# Patient Record
Sex: Female | Born: 1970 | Race: White | Hispanic: No | Smoking: Current every day smoker
Health system: Southern US, Community
[De-identification: ages and names within clinical notes are randomized; demographics above are authoritative.]

## PROBLEM LIST (undated history)

## (undated) DIAGNOSIS — R112 Nausea with vomiting, unspecified: Secondary | ICD-10-CM

## (undated) DIAGNOSIS — F17200 Nicotine dependence, unspecified, uncomplicated: Secondary | ICD-10-CM

## (undated) DIAGNOSIS — Z853 Personal history of malignant neoplasm of breast: Secondary | ICD-10-CM

## (undated) DIAGNOSIS — T4145XA Adverse effect of unspecified anesthetic, initial encounter: Secondary | ICD-10-CM

## (undated) DIAGNOSIS — F419 Anxiety disorder, unspecified: Secondary | ICD-10-CM

## (undated) DIAGNOSIS — F329 Major depressive disorder, single episode, unspecified: Secondary | ICD-10-CM

## (undated) DIAGNOSIS — Z98811 Dental restoration status: Secondary | ICD-10-CM

## (undated) DIAGNOSIS — R05 Cough: Secondary | ICD-10-CM

## (undated) DIAGNOSIS — F32A Depression, unspecified: Secondary | ICD-10-CM

## (undated) DIAGNOSIS — J111 Influenza due to unidentified influenza virus with other respiratory manifestations: Secondary | ICD-10-CM

## (undated) DIAGNOSIS — Z9889 Other specified postprocedural states: Secondary | ICD-10-CM

## (undated) DIAGNOSIS — T8859XA Other complications of anesthesia, initial encounter: Secondary | ICD-10-CM

## (undated) DIAGNOSIS — C801 Malignant (primary) neoplasm, unspecified: Secondary | ICD-10-CM

## (undated) HISTORY — PX: CARPAL TUNNEL RELEASE: SHX101

## (undated) HISTORY — DX: Depression, unspecified: F32.A

## (undated) HISTORY — PX: UMBILICAL HERNIA REPAIR: SHX196

## (undated) HISTORY — PX: HERNIA REPAIR: SHX51

## (undated) HISTORY — DX: Anxiety disorder, unspecified: F41.9

## (undated) HISTORY — DX: Major depressive disorder, single episode, unspecified: F32.9

---

## 1997-12-03 ENCOUNTER — Inpatient Hospital Stay (HOSPITAL_COMMUNITY): Admission: AD | Admit: 1997-12-03 | Discharge: 1997-12-03 | Payer: Self-pay | Admitting: Obstetrics and Gynecology

## 1998-02-22 ENCOUNTER — Ambulatory Visit (HOSPITAL_COMMUNITY): Admission: RE | Admit: 1998-02-22 | Discharge: 1998-02-22 | Payer: Self-pay | Admitting: Obstetrics and Gynecology

## 1998-03-15 ENCOUNTER — Observation Stay (HOSPITAL_COMMUNITY): Admission: AD | Admit: 1998-03-15 | Discharge: 1998-03-16 | Payer: Self-pay | Admitting: Obstetrics and Gynecology

## 1998-03-15 ENCOUNTER — Encounter: Payer: Self-pay | Admitting: Obstetrics and Gynecology

## 1998-03-26 ENCOUNTER — Encounter: Payer: Self-pay | Admitting: *Deleted

## 1998-03-26 ENCOUNTER — Inpatient Hospital Stay (HOSPITAL_COMMUNITY): Admission: AD | Admit: 1998-03-26 | Discharge: 1998-03-30 | Payer: Self-pay | Admitting: *Deleted

## 1998-03-30 ENCOUNTER — Encounter (HOSPITAL_COMMUNITY): Admission: RE | Admit: 1998-03-30 | Discharge: 1998-06-28 | Payer: Self-pay | Admitting: Obstetrics and Gynecology

## 1998-05-03 ENCOUNTER — Other Ambulatory Visit: Admission: RE | Admit: 1998-05-03 | Discharge: 1998-05-03 | Payer: Self-pay | Admitting: Obstetrics and Gynecology

## 1999-05-29 ENCOUNTER — Other Ambulatory Visit: Admission: RE | Admit: 1999-05-29 | Discharge: 1999-05-29 | Payer: Self-pay | Admitting: Obstetrics and Gynecology

## 2000-05-26 ENCOUNTER — Inpatient Hospital Stay (HOSPITAL_COMMUNITY): Admission: AD | Admit: 2000-05-26 | Discharge: 2000-05-28 | Payer: Self-pay | Admitting: Obstetrics and Gynecology

## 2000-07-16 ENCOUNTER — Other Ambulatory Visit: Admission: RE | Admit: 2000-07-16 | Discharge: 2000-07-16 | Payer: Self-pay | Admitting: Obstetrics and Gynecology

## 2001-07-26 ENCOUNTER — Other Ambulatory Visit: Admission: RE | Admit: 2001-07-26 | Discharge: 2001-07-26 | Payer: Self-pay | Admitting: Obstetrics and Gynecology

## 2002-10-06 ENCOUNTER — Other Ambulatory Visit: Admission: RE | Admit: 2002-10-06 | Discharge: 2002-10-06 | Payer: Self-pay | Admitting: Obstetrics and Gynecology

## 2004-03-25 ENCOUNTER — Other Ambulatory Visit: Admission: RE | Admit: 2004-03-25 | Discharge: 2004-03-25 | Payer: Self-pay | Admitting: Obstetrics and Gynecology

## 2014-11-25 DIAGNOSIS — Z853 Personal history of malignant neoplasm of breast: Secondary | ICD-10-CM

## 2014-11-25 HISTORY — DX: Personal history of malignant neoplasm of breast: Z85.3

## 2014-12-04 ENCOUNTER — Other Ambulatory Visit: Payer: Self-pay | Admitting: Physician Assistant

## 2014-12-04 DIAGNOSIS — R921 Mammographic calcification found on diagnostic imaging of breast: Secondary | ICD-10-CM

## 2014-12-08 ENCOUNTER — Ambulatory Visit
Admission: RE | Admit: 2014-12-08 | Discharge: 2014-12-08 | Disposition: A | Discharge status: Home / Self Care | Payer: BLUE CROSS/BLUE SHIELD | Source: Ambulatory Visit | Attending: Physician Assistant | Admitting: Physician Assistant

## 2014-12-08 ENCOUNTER — Other Ambulatory Visit: Payer: Self-pay | Admitting: Physician Assistant

## 2014-12-08 DIAGNOSIS — R921 Mammographic calcification found on diagnostic imaging of breast: Secondary | ICD-10-CM

## 2014-12-13 ENCOUNTER — Encounter: Payer: Self-pay | Admitting: *Deleted

## 2014-12-13 ENCOUNTER — Telehealth: Payer: Self-pay | Admitting: *Deleted

## 2014-12-13 DIAGNOSIS — C50411 Malignant neoplasm of upper-outer quadrant of right female breast: Secondary | ICD-10-CM | POA: Insufficient documentation

## 2014-12-13 NOTE — Telephone Encounter (Signed)
Confirmed [facility] for 12/20/14 at 0830 .  Instructions and contact information given.

## 2014-12-19 NOTE — Progress Notes (Addendum)
[place]  [phone] Fax:([phone]) [place] Note   Patient Care Team: Jene Every, MD as PCP - General (Physician Assistant) Rolm Bookbinder, MD as Consulting Physician (General Surgery) Truitt Merle, MD as Consulting Physician (Hematology) Thea Silversmith, MD as Consulting Physician (Radiation Oncology) Mauro Kaufmann, RN as Registered Nurse Rockwell Germany, RN as Registered Nurse Sylvan Cheese, NP as Nurse Practitioner (Hematology and Oncology) 12/20/2014  CHIEF COMPLAINTS/PURPOSE OF CONSULTATION:  Newly diagnosed DCIS  Oncology History   Breast cancer of upper-outer quadrant of right female breast [facility])   Staging form: Breast, AJCC 7th Edition     Clinical stage from 12/20/2014: Stage 0 (Tis (DCIS), N0, M0) - Unsigned       Breast cancer of upper-outer quadrant of right female breast ([place])   11/30/2014 Mammogram Screening mammogram showed a 9 mm group of calcifications in the upper outer right breast   12/11/2014 Initial Biopsy Breast, right, needle core biopsy, UOQ - DUCTAL CARCINOMA IN SITU WITH CALCIFICATIONS.   12/11/2014 Receptors her2 Estrogen Receptor: 95%, POSITIVE, STRONG STAINING INTENSITY Progesterone Receptor: 80%, POSITIVE, STRONG STAINING INTENSITY   12/13/2014 Initial Diagnosis Breast cancer of upper-outer quadrant of right female breast ([place])    HISTORY OF PRESENTING ILLNESS:  Kathy Golden 44 y.o. female is here because of her recently diagnosed DCIS. She is accompanied by her sister and her friend to our multidisciplinary breast clinic today.  This was found from screening mammo, she feels well, no pain or othe symptoms. She did not feel any lump in her breasts or axilla.  Her screening mammogram on 11/30/2014 showed 9 mm group of calcifications in the upper slightly outer right breast. She underwent stereotactic biopsy of the right breast calcification which showed DCIS. She tolerated the biopsy very  well.  MEDICAL HISTORY:  Past Medical History  Diagnosis Date  . Breast cancer of upper-outer quadrant of right female breast ([place]) 12/13/2014  . Anxiety   . Depression   . Hot flashes     SURGICAL HISTORY:  Past Surgical History  Procedure Laterality Date  . Cesarean section    . Umbilical hernia repair    . Carpal tunnel release Right    GYN HISTORY  Menarchal: 14 LMP: 11/29/2014  Contraceptive: 10 years HRT: n/a G2P2:    SOCIAL HISTORY: Social History   Social History  . Marital Status: Divorced    Spouse Name: N/A  . Number of Children: 73 yo daughter and 89 yo son   . Years of Education: N/A   Occupational History  .  hair dresser    Social History Main Topics  . Smoking status: Not on file  . Smokeless tobacco: Not on file  . Alcohol Use: Not on file  . Drug Use: Not on file  . Sexual Activity: Not on file   Other Topics Concern  . Not on file   Social History Narrative  . No narrative on file    FAMILY HISTORY: Family History  Problem Relation Age of Onset  . Breast cancer Maternal Grandmother 19    ALLERGIES:  is allergic to butorphanol and other.  MEDICATIONS:  Current Outpatient Prescriptions  Medication Sig Dispense Refill  . ALPRAZolam (XANAX) 1 MG tablet Take 0.5 tab twice daily as needed and 0.5 to 1 tablet as needed for sleep    . citalopram (CELEXA) 20 MG tablet Take 20 mg by mouth.     No current facility-administered medications for this  visit.    REVIEW OF SYSTEMS:   Constitutional: Denies fevers, chills or abnormal night sweats Eyes: Denies blurriness of vision, double vision or watery eyes Ears, nose, mouth, throat, and face: Denies mucositis or sore throat Respiratory: Denies cough, dyspnea or wheezes Cardiovascular: Denies palpitation, chest discomfort or lower extremity swelling Gastrointestinal:  Denies nausea, heartburn or change in bowel habits Skin: Denies abnormal skin rashes Lymphatics: Denies new  lymphadenopathy or easy bruising Neurological:Denies numbness, tingling or new weaknesses Behavioral/Psych: Mood is stable, no new changes  All other systems were reviewed with the patient and are negative.  PHYSICAL EXAMINATION: ECOG PERFORMANCE STATUS: 0 - Asymptomatic  Filed Vitals:   12/20/14 0844  BP: 114/70  Pulse: 67  Temp: 98.3 F (36.8 C)  Resp: 19   Filed Weights   12/20/14 0844  Weight: 144 lb 3.2 oz (65.409 kg)    GENERAL:alert, no distress and comfortable SKIN: skin color, texture, turgor are normal, no rashes or significant lesions EYES: normal, conjunctiva are pink and non-injected, sclera clear OROPHARYNX:no exudate, no erythema and lips, buccal mucosa, and tongue normal  NECK: supple, thyroid normal size, non-tender, without nodularity LYMPH:  no palpable lymphadenopathy in the cervical, axillary or inguinal LUNGS: clear to auscultation and percussion with normal breathing effort HEART: regular rate & rhythm and no murmurs and no lower extremity edema ABDOMEN:abdomen soft, non-tender and normal bowel sounds Musculoskeletal:no cyanosis of digits and no clubbing  PSYCH: alert & oriented x 3 with fluent speech NEURO: no focal motor/sensory deficits Breasts: Breast inspection showed them to be symmetrical with no nipple discharge. Palpation of the breasts and axilla revealed no obvious mass that I could appreciate.   LABORATORY DATA:  I have reviewed the data as listed Lab Results  Component Value Date   WBC 6.2 12/20/2014   HGB 13.9 12/20/2014   HCT 41.0 12/20/2014   MCV 91.7 12/20/2014   PLT 200 12/20/2014    Recent Labs  12/20/14 0818  NA 140  K 4.0  CO2 25  GLUCOSE 102  BUN 11.9  CREATININE 0.8  CALCIUM 9.4  PROT 6.5  ALBUMIN 3.8  AST 12  ALT <9  ALKPHOS 32  BILITOT 0.41     PATHOLOGY REPORT  Diagnosis 12/08/2014  Breast, right, needle core biopsy, UOQ - DUCTAL CARCINOMA IN SITU WITH CALCIFICATIONS. - SEE COMMENT.  The carcinoma  appears intermediate to high grade. Estrogen receptor and progesterone receptor studies will be performed and the results reported separately. The results were called to [facility] on 12/11/14. (JBK:ds 12/11/14)  Results: IMMUNOHISTOCHEMICAL AND MORPHOMETRIC ANALYSIS PERFORMED MANUALLY Estrogen Receptor: 95%, POSITIVE, STRONG STAINING INTENSITY Progesterone Receptor: 80%, POSITIVE, STRONG STAINING INTENSITY  RADIOGRAPHIC STUDIES: I have personally reviewed the radiological images as listed and agreed with the findings in the report. Mm Digital Diagnostic Unilat R  12/08/2014  CLINICAL DATA:  Status post right breast biopsy for calcifications EXAM: DIAGNOSTIC RIGHT MAMMOGRAM POST STEREOTACTIC BIOPSY COMPARISON:  Previous exam(s). FINDINGS: Mammographic images were obtained following right breast stereotactic vacuum assisted guided biopsy of calcifications in the slight upper and slight lateral right breast. Cc and lateral views of the right breast demonstrate biopsy clip in the area of concern. IMPRESSION: Post biopsy mammogram demonstrating biopsy clip in the area of concern. Final Assessment: Post Procedure Mammograms for Marker Placement Electronically Signed   By: Abelardo Diesel M.D.   On: 12/08/2014 14:55   Mm Rt Breast Bx W Loc Dev 1st Lesion Image Bx Spec Stereo Guide  12/11/2014  ADDENDUM REPORT: 12/11/2014 12:28 ADDENDUM: Pathology revealed intermediate to high grade ductal carcinoma in situ with calcifications in the right breast. This was found to be concordant by Dr. Abelardo Diesel. Pathology was discussed with the patient by telephone. She reported doing well after the biopsy. Post biopsy instructions and care were reviewed and her questions were answered. She has been [facility] at [facility] on December 20, 2014. My number was provided for additional concerns and questions. Pathology results reported by Susa Raring RN, BSN on  December 11, 2014. Electronically Signed   By: Abelardo Diesel M.D.   On: 12/11/2014 12:28  12/11/2014  CLINICAL DATA:  Calcifications right breast for biopsy EXAM: RIGHT BREAST STEREOTACTIC CORE NEEDLE BIOPSY COMPARISON:  Previous exams. FINDINGS: The patient and I discussed the procedure of stereotactic-guided biopsy including benefits and alternatives. We discussed the high likelihood of a successful procedure. We discussed the risks of the procedure including infection, bleeding, tissue injury, clip migration, and inadequate sampling. Informed written consent was given. The usual time out protocol was performed immediately prior to the procedure. Using sterile technique and 2% Lidocaine as local anesthetic, under stereotactic guidance, a 9 gauge stereotactic vacuum assisted device was used to perform core needle biopsy of calcifications in the upper slightly lateral right breast. Using a cranial approach. Specimen radiograph was performed showing inclusion a calcifications of concern. Specimens with calcifications are identified for pathology. At the conclusion of the procedure, a tissue marker clip was deployed into the biopsy cavity. Follow-up 2-view mammogram was performed and dictated separately. IMPRESSION: Stereotactic-guided biopsy of right. No apparent complications. Electronically Signed: By: Abelardo Diesel M.D. On: 12/08/2014 14:44    ASSESSMENT & PLAN: 44 year old premenopausal woman, presented with screening discovered DCIS.  1. Right breast DCIS, grade 3, ER+ /PR+ -I discussed her breast imaging and needle biopsy results with patient and her family members in great detail. -She is a candidate for breast conservation surgery. She was seen by breast surgeon Dr. Donne Hazel today, who recommends lumpectomy. She initially wanted bilateral mastectomy, but became more open to lumpectomy after discussion with [place]. -Given her young age and family history of breast cancer, we recommend her to undergo  genetic testing to ruled out inheritable breast cancer -Her DCIS will be cured by complete surgical resection. Any form of adjuvant therapy is preventive. -She will likely benefit from breast radiation if she undergo lumpectomy to decrease the risk of breast cancer. -Given her strong ER/PR positivity, young age, great baseline health, I recommend adjuvant endocrine therapy with tamoxifen. The benefit (reduce risk of breast cancer by 30-40%, but does not increase overall survival, increase bone density), and the risks (such as hot flash, skin and vaginal dryness, small risk of thrombosis and endometrial cancer, slightly increased risk of cardiovascular disease), were discussed with her in details. I given her written material for her to review. -We also discussed that biopsy may have sampling limitation, we will review her surgical path, to see if she has any invasive carcinoma components. -We discussed breast cancer surveillance after she completes treatment, Including annual mammogram, monthly self breast exam, and breast exam by a physician every 6-12 months.  Plan -She will likely have lumpectomy soon -Genetic refer -I'll review her surgical pathology report. I'll see her 2-3 weeks after her surgery to review her surgical pathology and finalize her adjuvant endocrine therapy.  All questions were answered. The patient knows to call the clinic with any problems, questions or concerns. I  spent 55 minutes counseling the patient face to face. The total time spent in the appointment was 60 minutes and more than 50% was on counseling.     Truitt Merle, MD 12/20/2014 5:10 PM

## 2014-12-20 ENCOUNTER — Other Ambulatory Visit (HOSPITAL_BASED_OUTPATIENT_CLINIC_OR_DEPARTMENT_OTHER): Payer: BLUE CROSS/BLUE SHIELD

## 2014-12-20 ENCOUNTER — Encounter: Payer: Self-pay | Admitting: Hematology

## 2014-12-20 ENCOUNTER — Ambulatory Visit
Admission: RE | Admit: 2014-12-20 | Discharge: 2014-12-20 | Disposition: A | Discharge status: Home / Self Care | Payer: BLUE CROSS/BLUE SHIELD | Source: Ambulatory Visit | Attending: Radiation Oncology | Admitting: Radiation Oncology

## 2014-12-20 ENCOUNTER — Encounter: Payer: Self-pay | Admitting: Nurse Practitioner

## 2014-12-20 ENCOUNTER — Ambulatory Visit (HOSPITAL_BASED_OUTPATIENT_CLINIC_OR_DEPARTMENT_OTHER): Payer: BLUE CROSS/BLUE SHIELD | Admitting: Hematology

## 2014-12-20 ENCOUNTER — Encounter: Payer: Self-pay | Admitting: Skilled Nursing Facility1

## 2014-12-20 ENCOUNTER — Ambulatory Visit: Payer: BLUE CROSS/BLUE SHIELD | Admitting: Physical Therapy

## 2014-12-20 VITALS — BP 114/70 | HR 67 | Temp 98.3°F | Resp 19 | Ht 63.0 in | Wt 144.2 lb

## 2014-12-20 DIAGNOSIS — C50411 Malignant neoplasm of upper-outer quadrant of right female breast: Secondary | ICD-10-CM

## 2014-12-20 DIAGNOSIS — Z170 Estrogen receptor positive status [ER+]: Secondary | ICD-10-CM | POA: Diagnosis not present

## 2014-12-20 DIAGNOSIS — D0511 Intraductal carcinoma in situ of right breast: Secondary | ICD-10-CM | POA: Diagnosis not present

## 2014-12-20 LAB — COMPREHENSIVE METABOLIC PANEL (CC13)
ALBUMIN: 3.8 g/dL (ref 3.5–5.0)
ALK PHOS: 49 U/L (ref 40–150)
ALT: <9 U/L (ref 0–55)
AST: 12 U/L (ref 5–34)
Anion Gap: 6 mEq/L (ref 3–11)
BUN: 11.9 mg/dL (ref 7.0–26.0)
CO2: 25 mEq/L (ref 22–29)
Calcium: 9.4 mg/dL (ref 8.4–10.4)
Chloride: 109 mEq/L (ref 98–109)
Creatinine: 0.8 mg/dL (ref 0.6–1.1)
GLUCOSE: 102 mg/dL (ref 70–140)
POTASSIUM: 4 meq/L (ref 3.5–5.1)
SODIUM: 140 meq/L (ref 136–145)
Total Bilirubin: 0.41 mg/dL (ref 0.20–1.20)
Total Protein: 6.5 g/dL (ref 6.4–8.3)

## 2014-12-20 LAB — CBC WITH DIFFERENTIAL/PLATELET
BASO%: 0.3 % (ref 0.0–2.0)
BASOS ABS: 0 10*3/uL (ref 0.0–0.1)
EOS ABS: 0.1 10*3/uL (ref 0.0–0.5)
EOS%: 1.1 % (ref 0.0–7.0)
HCT: 41 % (ref 34.8–46.6)
HEMOGLOBIN: 13.9 g/dL (ref 11.6–15.9)
LYMPH%: 21.8 % (ref 14.0–49.7)
MCH: 31.1 pg (ref 25.1–34.0)
MCHC: 33.9 g/dL (ref 31.5–36.0)
MCV: 91.7 fL (ref 79.5–101.0)
MONO#: 0.5 10*3/uL (ref 0.1–0.9)
MONO%: 8.3 % (ref 0.0–14.0)
NEUT#: 4.2 10*3/uL (ref 1.5–6.5)
NEUT%: 68.5 % (ref 38.4–76.8)
Platelets: 200 10*3/uL (ref 145–400)
RBC: 4.47 10*6/uL (ref 3.70–5.45)
RDW: 12.7 % (ref 11.2–14.5)
WBC: 6.2 10*3/uL (ref 3.9–10.3)
lymph#: 1.3 10*3/uL (ref 0.9–3.3)

## 2014-12-20 NOTE — Progress Notes (Signed)
Kathy Golden is a very pleasant 44 y.o. female from [place], [place] with newly diagnosed ductal carcinoma in situ of the right breast.  Biopsy results revealed the tumor's prognostic profile is ER positive and PR positive.  She presents today with her friends to the [facility]) for treatment consideration and recommendations from the breast surgeon, radiation oncologist, and medical oncologist.     I briefly met with Kathy Golden and her friends during her [facility] visit today. We discussed the purpose of the Survivorship Clinic, which will include monitoring for recurrence, coordinating completion of age and gender-appropriate cancer screenings, promotion of overall wellness, as well as managing potential late/long-term side effects of anti-cancer treatments.    The treatment plan for Kathy Golden will likely include surgery, radiation therapy, and anti-estrogen therapy.  She will meet with the Genetics Counselor due to her age. As of today, the intent of treatment for Kathy Golden is cure, therefore she will be eligible for the Survivorship Clinic upon her completion of treatment.  Her survivorship care plan (SCP) document will be drafted and updated throughout the course of her treatment trajectory. She will receive the SCP in an office visit with myself in the Survivorship Clinic once she has completed treatment.   Kathy Golden was encouraged to ask questions and all questions were answered to her satisfaction.  She was given my business card and encouraged to contact me with any concerns regarding survivorship.  I look forward to participating in her care.   Kenn File, [place] [phone]

## 2014-12-20 NOTE — Progress Notes (Signed)
Subjective:     Patient ID: Kathy Golden, female   DOB: May 30, 1970, 44 y.o.   MRN: [id number]  HPI   Review of Systems     Objective:   Physical Exam For the patient to understand and be given the tools to implement a healthy plant based diet during their cancer diagnosis.     Assessment:     Patient was seen today and found to be in good spirits and accompanied by 2 friends. Pts ht 5'3'', 144 pounds, BMI 25.6. Pts labs WNL. Pt looks well. Pt had no questions or concerns.     Plan:     Dietitian educated the patient on implementing a plant based diet by incorporating more plant proteins, fruits, and vegetables. As a part of a healthy routine physical activity was discussed. The importance of legitimate, evidence based information was discussed and examples were given. A folder of evidence based information with a focus on a plant based diet and general nutrition during cancer was given to the patient.  As a part of the continuum of care the cancer dietitian's contact information was given to the patient in the event they would like to have a follow up appointment.

## 2014-12-20 NOTE — Progress Notes (Signed)
Radiation Oncology         [phone]) [phone] ________________________________  Initial Outpatient Consultation - Date: 12/20/2014   Name: Kathy Golden MRN: [id number]   DOB: 1970-10-27  REFERRING PHYSICIAN: Jene Every, MD  DIAGNOSIS AND STAGE: Stage 0 DCIS of the Right Breast  HISTORY OF PRESENT ILLNESS::Kathy Golden is a 44 y.o. female who presented with a screening mammogram and found to have calcifications in the right breast. These measured 9 mm and biopsy showed high grade DCIS. This was ER/PR positive.   She is accompanied by 2 friends. She is interested in quitting smoking. She has done well since her biopsy with no pain or swelling.   She had a maternal grandmother with breast cancer who was diagnosed at age 65.  PREVIOUS RADIATION THERAPY: No  Past medical, social and family history were reviewed in the electronic chart. Review of symptoms was reviewed in the electronic chart. Medications were reviewed in the electronic chart.   Gynecologic History  Age at first menstrual period? 14  Are you still having periods? Yes Approximate date of last period? 11/25/14  If you are still having periods: Are your periods regular? Yes  If you no longer have periods: Have you used hormone replacement? No Obstetric History:  How many children have you carried to term? 2 Your age at first live birth? 69  Pregnant now or trying to get pregnant? No  Have you used birth control pills or hormone shots for contraception? Yes Health Maintenance:  Have you ever had a colonoscopy? No  Have you ever had a bone density? No  Date of your last PAP smear? 11/25/14 Date of your FIRST mammogram? Age 1  PHYSICAL EXAM:  Vitals with BMI 12/20/2014  Height 5' 3"   Weight 144 lbs 3 oz  BMI 32.5  Systolic 498  Diastolic 70  Pulse 67  Respirations 19  Pleasant woman in no distress. She has a small biopsy scar in the upper outer quadrant of the right breast. No palpable abnormalities beneath this. No  palpable abnormalities in the left breast. No palpable cervical, supraclavicular, or axillary adenopathy.  IMPRESSION: Stage 0 DCIS of the Right Breast  PLAN: I spoke to the patient today regarding her diagnosis and options for treatment. We discussed the equivalence in terms of survival and local failure between mastectomy and breast conservation. We discussed the role of radiation in decreasing local failures in patients who undergo lumpectomy. We discussed the process of simulation and the placement tattoos. We discussed 4-6 weeks of treatment as an outpatient. We discussed the possibility of asymptomatic lung damage. We discussed the low likelihood of secondary malignancies. We discussed the possible side effects including but not limited to skin redness, fatigue, permanent skin darkening, and breast swelling.   She also met with surgery, medical oncology as well as a member of our patient family support team, a dietitian, survivorship nurse practitioner, breast cancer navigator, and our physical therapist.  She will be referred to genetics on October 27 at [facility]. She has a plastic surgery appointment scheduled on November 9.  She has a second opinion at [facility] today at [facility].   I spent 40 minutes  face to face with the patient and more than 50% of that time was spent in counseling and/or coordination of care.   This document serves as a record of services personally performed by Thea Silversmith, MD. It was created on her behalf by Darcus Austin, a trained medical scribe. The creation  of this record is based on the scribe's personal observations and the provider's statements to them. This document has been checked and approved by the attending provider.   ------------------------------------------------  Thea Silversmith, MD

## 2014-12-21 ENCOUNTER — Ambulatory Visit (HOSPITAL_BASED_OUTPATIENT_CLINIC_OR_DEPARTMENT_OTHER): Payer: BLUE CROSS/BLUE SHIELD | Admitting: Genetic Counselor

## 2014-12-21 ENCOUNTER — Other Ambulatory Visit: Payer: BLUE CROSS/BLUE SHIELD

## 2014-12-21 ENCOUNTER — Encounter: Payer: Self-pay | Admitting: Genetic Counselor

## 2014-12-21 DIAGNOSIS — Z809 Family history of malignant neoplasm, unspecified: Secondary | ICD-10-CM

## 2014-12-21 DIAGNOSIS — C50411 Malignant neoplasm of upper-outer quadrant of right female breast: Secondary | ICD-10-CM

## 2014-12-21 DIAGNOSIS — Z315 Encounter for genetic counseling: Secondary | ICD-10-CM | POA: Diagnosis not present

## 2014-12-21 DIAGNOSIS — Z803 Family history of malignant neoplasm of breast: Secondary | ICD-10-CM | POA: Diagnosis not present

## 2014-12-21 DIAGNOSIS — D0511 Intraductal carcinoma in situ of right breast: Secondary | ICD-10-CM | POA: Diagnosis not present

## 2014-12-21 NOTE — Progress Notes (Signed)
REFERRING PROVIDER: Truitt Merle, MD  PRIMARY PROVIDER:  BOOTH, Katy Fitch, MD  PRIMARY REASON FOR VISIT:  1. Breast cancer of upper-outer quadrant of right female breast ([place])   2. Family history of breast cancer   3. Family history of cancer      HISTORY OF PRESENT ILLNESS:   Kathy Golden, a 44 y.o. female, was seen for a [place] cancer genetics consultation at the request of Kathy Golden due to a personal history of breast cancer at 24 and family history of breast cancer.  Kathy Golden presents to clinic today with her sister, Kathy Golden, to discuss the possibility of a hereditary predisposition to cancer, genetic testing, and to further clarify her future cancer risks, as well as potential cancer risks for family members.   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 Hormone receptor status was ER/PR+.  Surgical decisions are pending and will be determined following return of genetic test results.     CANCER HISTORY:  Oncology History   Breast cancer of upper-outer quadrant of right female breast [facility])   Staging form: Breast, AJCC 7th Edition     Clinical stage from 12/20/2014: Stage 0 (Tis (DCIS), N0, M0) - Unsigned       Breast cancer of upper-outer quadrant of right female breast ([place])   11/30/2014 Mammogram Screening mammogram showed a 9 mm group of calcifications in the upper outer right breast   12/11/2014 Initial Biopsy Breast, right, needle core biopsy, UOQ - DUCTAL CARCINOMA IN SITU WITH CALCIFICATIONS.   12/11/2014 Receptors her2 Estrogen Receptor: 95%, POSITIVE, STRONG STAINING INTENSITY Progesterone Receptor: 80%, POSITIVE, STRONG STAINING INTENSITY   12/13/2014 Initial Diagnosis Breast cancer of upper-outer quadrant of right female breast ([place])     HORMONAL RISK FACTORS:  Menarche was at age 18.  First live birth at age 31.  OCP use for approximately 10 years Ovaries intact: yes.  Hysterectomy: no.  Menopausal status: premenopausal.  HRT  use: 0 years. Colonoscopy: no; not examined. Mammogram within the last year: yes. Number of breast biopsies: 1. Up to date with pelvic exams:  yes. 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 Breast cancer of upper-outer quadrant of right female breast ([place]) 12/13/2014  . Anxiety   . Depression   . Hot flashes     Past Surgical History  Procedure Laterality Date  . Cesarean section    . Umbilical hernia repair    . Carpal tunnel release Right     Social History   Social History  . Marital Status: Divorced    Spouse Name: N/A  . Number of Children: N/A  . Years of Education: N/A   Social History Main Topics  . Smoking status: Current Every Day Smoker -- 1.00 packs/day for 30 years    Types: Cigarettes  . Smokeless tobacco: Never Used  . Alcohol Use: No  . Drug Use: No  . Sexual Activity: Not on file   Other Topics Concern  . Not on file   Social History Narrative     FAMILY HISTORY:  We obtained a detailed, 4-generation family history.  Significant diagnoses are listed below: Family History  Problem Relation Age of Onset  . Breast cancer Maternal Grandmother 68  . Other Sister     hx of TAH-BSO for tumor in uterus (non-cancerous)  . Cancer  Paternal Aunt     dx. early 22s; unspecified type  . Heart attack Maternal Grandfather   . Heart attack Paternal Grandmother     Kathy Golden has one son, age 86, and one daughter, age 45.  She has one full sister, age 24, and one full brother, age 17.  Neither of her siblings has had cancer, but her sister reports having had a TAH removed to address a non-cancerous tumor in her uterus.  Kathy Golden mother is 65 and has not had cancer.  Her father is 86 and has not had cancer.    Kathy Golden mother has two full sisters and one full brother between the ages of 72 and 56.  None of these aunts/uncles have had cancer and none of their  children have had cancer.  Kathy Golden maternal grandmother was diagnosed with breast cancer at 55, that metastasized to her bones and she passed away at 8.  This grandmother had two sisters and one brother--none of whom had cancer to Kathy Golden's knowledge.  Kathy Golden maternal grandfather died of a heart attack at 75.  Kathy Golden has limited information for some of her grandfather's siblings, but reports that his full sister and full brothers never had cancer.    Kathy Golden dad had two full sisters.  One paternal aunt died at 71 and had a history of an unspecified type cancer, diagnosed at 76.  The other aunt is currently 6 and has not had cancer.  There is no known cancers in any of the paternal first cousins.  Kathy Golden paternal grandmother died at 102 of a heart attack.  She had two full sisters and one brother--one sister had an unknown cancer, likely a breast cancer.  Kathy Golden is unaware of any additional cancer diagnoses.    Patient's maternal and paternal ancestors are of Caucasian descent. There is no reported Ashkenazi Jewish ancestry. There is no known consanguinity.  GENETIC COUNSELING ASSESSMENT: Kathy Golden is a 44 y.o. female with a personal and family history of cancer which is somewhat suggestive of a hereditary breast cancer syndrome and predisposition to cancer. We, therefore, discussed and recommended the following at today's visit.   DISCUSSION: We reviewed the characteristics, features and inheritance patterns of hereditary cancer syndromes, particularly those caused by mutations within the BRCA1/2 genes. We also discussed genetic testing, including the appropriate family members to test, the process of testing, insurance coverage and turn-around-time for results. We discussed the implications of a negative, positive and/or variant of uncertain significant result.  We discussed the benefits of smaller vs. Larger panel testing.  Kathy Golden would prefer to have larger panel  testing, as she would like to have more information.  We recommended Kathy Golden pursue genetic testing for the 20-gene Breast/Ovarian Cancer Panel through [facility] Hope Pigeon, MD).  The Breast/Ovarian Cancer anel offered by [facility] includes sequencing and deletion/duplication analysis for the following 19 genes:  ATM, BARD1, BRCA1, BRCA2, BRIP1, CDH1, CHEK2, FANCC, MLH1, MSH2, MSH6, NBN, PALB2, PMS2, PTEN, RAD51C, RAD51D, TP53, and XRCC2.  This panel also includes deletion/duplication analysis (without sequencing) for one gene, EPCAM.   Based on Kathy Golden's personal and family history of cancer, she meets medical criteria for genetic testing. Despite that she meets criteria, she may still have an out of pocket cost. We discussed that if her out of pocket cost for testing is over $100, the laboratory will call and confirm whether she wants to proceed with testing.  If the  out of pocket cost of testing is less than $100 she will be billed by the genetic testing laboratory.   PLAN: After considering the risks, benefits, and limitations, Kathy Golden  provided informed consent to pursue genetic testing and the blood sample was sent to [facility] for analysis of the 20-gene Breast/Ovarian Cancer Panel test. Results are generally available within approximately 2-3 weeks' time, however, since Ms. Taliaferro will use this information to make important surgical decisions, we will ask that [facility] return these results STAT.  Thus, we will likely get these results closer to the 2-week timeframe at which point they will be disclosed by telephone to Kathy Golden, as will any additional recommendations warranted by these results. Ms. Odom will receive a summary of her genetic counseling visit and a copy of her results once available. This information will also be available in Epic. We encouraged Ms. Righetti to remain in contact with cancer genetics annually so that we can continuously update the family history and  inform her of any changes in cancer genetics and testing that may be of benefit for her family. Ms. Holian questions were answered to her satisfaction today. Our contact information was provided should additional questions or concerns arise.  Thank you for the referral and allowing [place] to share in the care of your patient.   Jeanine Luz, MS Genetic Counselor [email or web address] Phone: [phone]  The patient was seen for a total of 55 minutes in face-to-face genetic counseling.  This patient was discussed with Drs. Magrinat, Lindi Adie and/or Burr Golden who agrees with the above.    _______________________________________________________________________ For Office Staff:  Number of people involved in session: 2 Was an Intern/ student involved with case: no

## 2014-12-25 ENCOUNTER — Telehealth: Payer: Self-pay | Admitting: *Deleted

## 2014-12-25 ENCOUNTER — Encounter: Payer: Self-pay | Admitting: *Deleted

## 2014-12-25 NOTE — Progress Notes (Signed)
Clinical Social Work [place] Psychosocial Distress Screening [place]  Patient completed distress screening protocol and scored a 10 on the Psychosocial Distress Thermometer which indicates severe distress. Clinical Social Worker met with patient and patients family/friends in [facility] to assess for distress and other psychosocial needs. Patient stated she was doing "ok" and felt comfortable with her treatment plan and treatment team. CSW and patient discussed common feeling and emotions when being diagnosed with cancer, and the importance of support during treatment. CSW informed patient of the support team and support services at [facility], and patient was agreeable to an alight guide. CSW provided contact information and encouraged patient to call with any questions or concerns.    ONCBCN DISTRESS SCREENING 12/25/2014  Screening Type Initial Screening  Distress experienced in past week (1-10) 10  Emotional problem type Depression;Nervousness/Anxiety  Spiritual/Religous concerns type Relating to God  Information Concerns Type Lack of info about diagnosis;Lack of info about treatment;Lack of info about complementary therapy choices;Lack of info about maintaining fitness  Physician notified of physical symptoms Yes  Referral to clinical psychology No  Referral to clinical social work Yes  Referral to dietition No  Referral to financial advocate No  Referral to support programs Yes  Referral to palliative care No   Johnnye Lana, MSW, LCSW, OSW-C Clinical Social Worker [place] [phone]

## 2014-12-25 NOTE — Telephone Encounter (Signed)
Spoke with patient from [facility] 12/20/14.  She had her appt. With Dr. Iran Planas today and is confused about what she wants to do.  I informed just to take some time and let the information she received today settle a bit.  She states everyone is so quick to tell what they would do.  Informed her that ultimately this is her decision and she has to feel comfortable with decision she makes.  Encouraged her to call with any needs or concerns.

## 2015-01-01 ENCOUNTER — Ambulatory Visit: Payer: Self-pay | Admitting: Genetic Counselor

## 2015-01-01 ENCOUNTER — Telehealth: Payer: Self-pay | Admitting: Genetic Counselor

## 2015-01-01 DIAGNOSIS — Z1379 Encounter for other screening for genetic and chromosomal anomalies: Secondary | ICD-10-CM

## 2015-01-01 NOTE — Telephone Encounter (Signed)
Discussed with Ms. Kathy Golden that her genetic testing was negative for pathogenic mutations within any of 20 genes that would cause her to be at an increased risk for breast, ovarian, or other related cancers.  Additionally, no variants of uncertain significance (VUSes) were found.  We discussed that this is likely a reassuring result for [place] since the only other breast cancer in the family was diagnosed in her grandmother at the age of 55.  Although we cannot rule out a genetic cause for her cancer (since we may not be testing the right genes at this point in time), we discussed that it is most likely that her cancer just happened by chance (as is the case with most cancers).  Ms. Kathy Golden should keep in touch with [place] if anyone is diagnosed with cancer in the family in the future.  If she finds out about what type of cancer her paternal aunt and paternal great aunt had, she is also welcome to call and update her family history with [place].  We discussed that she should still follow the cancer screening recommendations of her doctors.  Her sister should continue annual mammogram screening.  Women in the family are still considered to be at an increased risk for breast cancer, just based on the family history, so Ms. Kathy Golden's daughters and nieces could begin annual mammogram screening at 17 (64 years younger than her diagnosis).  Since they are young, they should make their doctors aware of the family history of breast cancer, so they may receive the most appropriate cancer screening in the future, as guidelines change.  Ms. Kathy Golden and her family members are welcome to call me with any further questions they may have.

## 2015-01-08 DIAGNOSIS — Z1379 Encounter for other screening for genetic and chromosomal anomalies: Secondary | ICD-10-CM | POA: Insufficient documentation

## 2015-01-08 NOTE — Progress Notes (Signed)
GENETIC TEST RESULT  HPI: Ms. Arbuthnot was previously seen in the [facility] due to a personal history of breast cancer at 26, family history of breast and other cancers and concerns regarding a hereditary predisposition to cancer. Please refer to our prior cancer genetics clinic note from December 21, 2014 for more information regarding Ms. Montufar's medical, social and family histories, and our assessment and recommendations, at the time. Ms. Schwark recent genetic test results were disclosed to her, as were recommendations warranted by these results. These results and recommendations are discussed in more detail below.  GENETIC TEST RESULTS: At the time of Ms. Otwell's visit on 12/21/14, we recommended she pursue genetic testing of the 20-gene Breast/Ovarian Cancer Panel through [facility].  The Breast/Ovarian Cancer Panel offered by [facility] Hope Pigeon, MD) includes sequencing and deletion/duplication analysis for the following 19 genes:  ATM, BARD1, BRCA1, BRCA2, BRIP1, CDH1, CHEK2, FANCC, MLH1, MSH2, MSH6, NBN, PALB2, PMS2, PTEN, RAD51C, RAD51D, TP53, and XRCC2.  This panel also includes deletion/duplication analysis (without sequencing) for one gene, EPCAM.  Those results are now back, the report date for which is January 01, 2015.  Genetic testing was normal, and did not reveal a deleterious mutation in these genes.  Additionally, no variants of uncertain significance (VUSes) were found.  The test report will be scanned into EPIC and will be located under the Results Review tab in the Pathology>Molecular Pathology section.   We discussed with Ms. Muratalla that since the current genetic testing is not perfect, it is possible there may be a gene mutation in one of these genes that current testing cannot detect, but that chance is small. We also discussed, that it is possible that another gene that has not yet been discovered, or that we have not yet tested, is  responsible for the cancer diagnoses in the family, and it is, therefore, important to remain in touch with cancer genetics in the future so that we can continue to offer Ms. Yokoyama the most up to date genetic testing.    CANCER SCREENING RECOMMENDATIONS: This result is reassuring and indicates that Ms. Ellzey likely does not have an increased risk for a future cancer due to a mutation in one of these genes. This normal test also suggests that Ms. Solecki's cancer was most likely not due to an inherited predisposition associated with one of these genes.  Most cancers happen by chance and this negative test suggests that her cancer falls into this category.  Additionally, the only other instance of breast cancer in the family was diagnosed in Ms. Keenum's maternal grandmother at the age of 11.  Many relatives (including Ms. Keown's parents) have lived to later ages in life and have not had cancer.  We, therefore, recommended she continue to follow the cancer management and screening guidelines provided by her oncology and primary healthcare providers.   RECOMMENDATIONS FOR FAMILY MEMBERS: Women in this family might be at some increased risk of developing cancer, over the general population risk, simply due to the family history of cancer. We recommended women in this family have a yearly mammogram beginning at age 39, or 66 years younger than the earliest onset of cancer, an an annual clinical breast exam, and perform monthly breast self-exams.  Based on Ms. 7 age of diagnosis at 49, her daughter and likely her nieces can begin annual mammogram screening at 67.  Since they are still young, they should make their doctors aware of the family history of  breast cancer in the future, so that they may receive the most appropriate screening at that point in time, especially as guidelines evolve.  Women in this family should also have a gynecological exam as recommended by their primary provider. All family members  should have a colonoscopy by age 76.  FOLLOW-UP: Lastly, we discussed with Ms. Lakins that cancer genetics is a rapidly advancing field and it is possible that new genetic tests will be appropriate for her and/or her family members in the future. We encouraged her to remain in contact with cancer genetics on an annual basis so we can update her personal and family histories and let her know of advances in cancer genetics that may benefit this family.  Additionally, if she finds out what type of cancer her paternal aunt and paternal great aunt had, she is welcome to call and update [place] with that information.    Our contact number was provided. Ms. Fretz questions were answered to her satisfaction, and she knows she is welcome to call us at anytime with additional questions or concerns.   Jeanine Luz, MS Genetic Counselor [email or web address] Phone: [phone]

## 2015-01-11 ENCOUNTER — Other Ambulatory Visit: Payer: Self-pay | Admitting: General Surgery

## 2015-01-11 DIAGNOSIS — C50411 Malignant neoplasm of upper-outer quadrant of right female breast: Secondary | ICD-10-CM

## 2015-01-17 ENCOUNTER — Telehealth: Payer: Self-pay | Admitting: Hematology

## 2015-01-17 ENCOUNTER — Other Ambulatory Visit: Payer: Self-pay | Admitting: *Deleted

## 2015-01-17 NOTE — Telephone Encounter (Signed)
per pof to sch pt appt-cld & spoke to pt and gave pt time & date of appt 03/01/15-pt understood

## 2015-01-25 NOTE — H&P (Signed)
  Subjective:    Patient ID: Kathy Golden is a 44 y.o. female.  HPI  Here for follow up discussion breast reconstruction. Presented with a screening MMG and found to have calcifications in the right breast. These measured 9 mm and biopsy showed high grade DCIS, ER/PR +. Patient has elected for bilateral NSM with expander placement.  Genetics negative. No known family history of breast ca with exception MGM diagnoed with metastatic cancer and had abnormality breast.  Quit smoking 5 weeks ago, on Chantix. Works as Theatre manager.  Current D/DD cup, desires smaller Wt 10 lbs variance last 6 months  Review of Systems     Objective:   Physical Exam  Cardiovascular: Normal rate, regular rhythm and normal heart sounds.  Pulmonary/Chest: Effort normal and breath sounds normal.  Abdominal: Soft.  Soft tissue redundancy without panniculus  Genitourinary: No breast discharge.  Genitourinary Comments: No masses, grade 1 ptosis bilat  Lymphadenopathy:  She has no axillary adenopathy.     SN to nipple 25 L 25.5 BW R17 L 17 cm Nipple to IMF R 10 L 11 cm  Assessment:     DCIS Right UOQ     Plan:     Plan bilateral expander, possible acellular dermis reconstruction. Reviewed expected hospital stay, drains, recovery and time off work . Reviewed use of acellular dermis in reconstruction. Reviewed process of expansion, additional surgery for implant placement. Reviewed risks including but not limited to infection, seroma, DVT/PE, cardiopulmonary complications, hematoma, rupture, infection requiring surgery or removal, mastectomy flap loss, loss of NAC, and damage to deeper structures.   Irene Limbo, MD [facility] [phone]

## 2015-01-30 ENCOUNTER — Encounter (HOSPITAL_COMMUNITY)
Admission: RE | Admit: 2015-01-30 | Discharge: 2015-01-30 | Disposition: A | Discharge status: Home / Self Care | Payer: BLUE CROSS/BLUE SHIELD | Source: Ambulatory Visit | Attending: General Surgery | Admitting: General Surgery

## 2015-01-30 ENCOUNTER — Encounter (HOSPITAL_COMMUNITY): Payer: Self-pay

## 2015-01-30 DIAGNOSIS — Z87891 Personal history of nicotine dependence: Secondary | ICD-10-CM | POA: Diagnosis not present

## 2015-01-30 DIAGNOSIS — D0511 Intraductal carcinoma in situ of right breast: Secondary | ICD-10-CM | POA: Diagnosis not present

## 2015-01-30 LAB — CBC WITH DIFFERENTIAL/PLATELET
BASOS ABS: 0 10*3/uL (ref 0.0–0.1)
BASOS PCT: 0 %
EOS ABS: 0.1 10*3/uL (ref 0.0–0.7)
EOS PCT: 1 %
HCT: 38.3 % (ref 36.0–46.0)
Hemoglobin: 13.2 g/dL (ref 12.0–15.0)
LYMPHS PCT: 24 %
Lymphs Abs: 2.1 10*3/uL (ref 0.7–4.0)
MCH: 31.2 pg (ref 26.0–34.0)
MCHC: 34.5 g/dL (ref 30.0–36.0)
MCV: 90.5 fL (ref 78.0–100.0)
Monocytes Absolute: 0.7 10*3/uL (ref 0.1–1.0)
Monocytes Relative: 8 %
Neutro Abs: 5.7 10*3/uL (ref 1.7–7.7)
Neutrophils Relative %: 67 %
PLATELETS: 228 10*3/uL (ref 150–400)
RBC: 4.23 MIL/uL (ref 3.87–5.11)
RDW: 12.9 % (ref 11.5–15.5)
WBC: 8.6 10*3/uL (ref 4.0–10.5)

## 2015-01-30 LAB — HCG, SERUM, QUALITATIVE: Preg, Serum: NEGATIVE

## 2015-01-30 NOTE — Pre-Procedure Instructions (Signed)
    Kathy Golden  01/30/2015     Your procedure is scheduled on Friday, December 9..  Report to [facility] Admitting at 11:00A.M.                 Your surgery is scheduled for 1:00 P.M.   Call this number if you have problems the morning of surgery:[phone]                    For any other questions, please call [phone], Monday - Friday 8 AM - 4 PM.   Remember:  Do not eat food or drink liquids after midnight.  Take these medicines the morning of surgery with A SIP OF WATER :citalopram (CELEXA).  MAY take ALPRAZolam [name] if needed.                    Stop taking Aspirin, Coumadin, Plavix, Effient and Herbal medications.  Do not take any NSAIDs ie: Ibuprofen,  Advil,Naproxen or any medication containing Aspirin.   Do not wear jewelry, make-up or nail polish.   Do not wear lotions, powders, or perfumes.    Do not shave 48 hours prior to surgery.     Do not bring valuables to the hospital.   [facility] is not responsible for any belongings or valuables.  Contacts, dentures or bridgework may not be worn into surgery.  Leave your suitcase in the car.  After surgery it may be brought to your room.  For patients admitted to the hospital, discharge time will be determined by your treatment team.  Patients discharged the day of surgery will not be allowed to drive home.   Name and phone number of your driver:   -  Special instructions:  Review  [place] - Preparing For Surgery.   Please read over the following fact sheets that you were given. Pain Booklet, Coughing and Deep Breathing and Surgical Site Infection Prevention

## 2015-01-30 NOTE — Pre-Procedure Instructions (Signed)
    Kathy Golden  01/30/2015     Your procedure is scheduled on Friday, December 9..  Report to [facility] Admitting at 11:00A.M.                 Your surgery is scheduled for 1:00 P.M.   Call this number if you have problems the morning of surgery:[phone]                    For any other questions, please call [phone], Monday - Friday 8 AM - 4 PM.   Remember:  Do not eat food or drink liquids after midnight.  Take these medicines the morning of surgery with A SIP OF WATER :citalopram (CELEXA).  MAY take ALPRAZolam [name] if needed.                    Stop taking Aspirin, Coumadin, Plavix, Effient and Herbal medications.  Do not take any NSAIDs ie: Ibuprofen,  Advil,Naproxen or any medication containing Aspirin.   Do not wear jewelry, make-up or nail polish.   Do not wear lotions, powders, or perfumes.    Do not shave 48 hours prior to surgery.     Do not bring valuables to the hospital.   [facility] is not responsible for any belongings or valuables.  Contacts, dentures or bridgework may not be worn into surgery.  Leave your suitcase in the car.  After surgery it may be brought to your room.  For patients admitted to the hospital, discharge time will be determined by your treatment team.  Special instructions:  Review  [place] - Preparing For Surgery. Please read over the following fact sheets that you were given. Pain Booklet, Coughing and Deep Breathing and Surgical Site Infection Prevention

## 2015-02-01 MED ORDER — CEFAZOLIN SODIUM-DEXTROSE 2-3 GM-% IV SOLR
2.0000 g | INTRAVENOUS | Status: AC
  Administered 2015-02-02: 2 g via INTRAVENOUS
  Filled 2015-02-01 (×2): qty 50

## 2015-02-02 ENCOUNTER — Encounter (HOSPITAL_COMMUNITY)
Admission: RE | Admit: 2015-02-02 | Discharge: 2015-02-02 | Disposition: A | Discharge status: Home / Self Care | Payer: BLUE CROSS/BLUE SHIELD | Source: Ambulatory Visit | Attending: General Surgery | Admitting: General Surgery

## 2015-02-02 ENCOUNTER — Observation Stay (HOSPITAL_COMMUNITY)
Admission: RE | Admit: 2015-02-02 | Discharge: 2015-02-03 | Disposition: A | Discharge status: Home / Self Care | Payer: BLUE CROSS/BLUE SHIELD | Source: Ambulatory Visit | Attending: General Surgery | Admitting: General Surgery

## 2015-02-02 ENCOUNTER — Encounter (HOSPITAL_COMMUNITY): Payer: Self-pay | Admitting: *Deleted

## 2015-02-02 ENCOUNTER — Encounter (HOSPITAL_COMMUNITY)
Admission: RE | Disposition: A | Discharge status: Home / Self Care | Payer: Self-pay | Source: Ambulatory Visit | Attending: General Surgery

## 2015-02-02 ENCOUNTER — Ambulatory Visit (HOSPITAL_COMMUNITY): Payer: BLUE CROSS/BLUE SHIELD | Admitting: Anesthesiology

## 2015-02-02 DIAGNOSIS — D0510 Intraductal carcinoma in situ of unspecified breast: Secondary | ICD-10-CM | POA: Diagnosis present

## 2015-02-02 DIAGNOSIS — C50411 Malignant neoplasm of upper-outer quadrant of right female breast: Secondary | ICD-10-CM

## 2015-02-02 DIAGNOSIS — D0511 Intraductal carcinoma in situ of right breast: Principal | ICD-10-CM | POA: Insufficient documentation

## 2015-02-02 DIAGNOSIS — Z87891 Personal history of nicotine dependence: Secondary | ICD-10-CM | POA: Insufficient documentation

## 2015-02-02 HISTORY — PX: BREAST RECONSTRUCTION WITH PLACEMENT OF TISSUE EXPANDER AND FLEX HD (ACELLULAR HYDRATED DERMIS): SHX6295

## 2015-02-02 HISTORY — PX: NIPPLE SPARING MASTECTOMY/SENTINAL LYMPH NODE BIOPSY/RECONSTRUCTION/PLACEMENT OF TISSUE EXPANDER: SHX6484

## 2015-02-02 LAB — BASIC METABOLIC PANEL
Anion gap: 7 (ref 5–15)
BUN: 10 mg/dL (ref 6–20)
CALCIUM: 9.3 mg/dL (ref 8.9–10.3)
CO2: 25 mmol/L (ref 22–32)
CREATININE: 0.75 mg/dL (ref 0.44–1.00)
Chloride: 106 mmol/L (ref 101–111)
GFR calc non Af Amer: >60 mL/min (ref >60)
GLUCOSE: 97 mg/dL (ref 65–99)
Potassium: 4 mmol/L (ref 3.5–5.1)
Sodium: 138 mmol/L (ref 135–145)

## 2015-02-02 SURGERY — NIPPLE SPARING MASTECTOMY WITH SENTINAL LYMPH NODE BIOPSY AND  RECONSTRUCTION WITH PLACEMENT OF TISSUE EXPANDER
Anesthesia: General | Site: Breast | Laterality: Bilateral

## 2015-02-02 MED ORDER — DIPHENHYDRAMINE HCL 50 MG/ML IJ SOLN: 12.5000 mg | Freq: Four times a day (QID) | INTRAMUSCULAR | Status: DC | PRN

## 2015-02-02 MED ORDER — BUPIVACAINE-EPINEPHRINE (PF) 0.5% -1:200000 IJ SOLN
INTRAMUSCULAR | Status: DC | PRN
  Administered 2015-02-02: 30 mL

## 2015-02-02 MED ORDER — MIDAZOLAM HCL 5 MG/5ML IJ SOLN
INTRAMUSCULAR | Status: DC | PRN
  Administered 2015-02-02: 2 mg via INTRAVENOUS

## 2015-02-02 MED ORDER — FENTANYL CITRATE (PF) 250 MCG/5ML IJ SOLN
INTRAMUSCULAR | Status: AC
  Filled 2015-02-02: qty 5

## 2015-02-02 MED ORDER — FENTANYL CITRATE (PF) 100 MCG/2ML IJ SOLN
INTRAMUSCULAR | Status: AC
  Filled 2015-02-02: qty 2

## 2015-02-02 MED ORDER — CITALOPRAM HYDROBROMIDE 20 MG PO TABS
20.0000 mg | ORAL_TABLET | Freq: Every day | ORAL | Status: DC
  Administered 2015-02-03: 20 mg via ORAL
  Filled 2015-02-02: qty 1

## 2015-02-02 MED ORDER — FENTANYL CITRATE (PF) 100 MCG/2ML IJ SOLN
100.0000 ug | Freq: Once | INTRAMUSCULAR | Status: AC
  Administered 2015-02-02: 100 ug via INTRAVENOUS

## 2015-02-02 MED ORDER — SUCCINYLCHOLINE CHLORIDE 20 MG/ML IJ SOLN
INTRAMUSCULAR | Status: AC
  Filled 2015-02-02: qty 1

## 2015-02-02 MED ORDER — DEXAMETHASONE SODIUM PHOSPHATE 10 MG/ML IJ SOLN
INTRAMUSCULAR | Status: DC | PRN
  Administered 2015-02-02: 10 mg via INTRAVENOUS

## 2015-02-02 MED ORDER — SULFAMETHOXAZOLE-TRIMETHOPRIM 800-160 MG PO TABS: 1.0000 | ORAL_TABLET | Freq: Two times a day (BID) | ORAL | Status: DC

## 2015-02-02 MED ORDER — ONDANSETRON HCL 4 MG/2ML IJ SOLN
INTRAMUSCULAR | Status: AC
  Filled 2015-02-02: qty 2

## 2015-02-02 MED ORDER — MIDAZOLAM HCL 2 MG/2ML IJ SOLN
INTRAMUSCULAR | Status: AC
  Administered 2015-02-02: 2 mg
  Filled 2015-02-02: qty 2

## 2015-02-02 MED ORDER — PHENYLEPHRINE HCL 10 MG/ML IJ SOLN
INTRAMUSCULAR | Status: DC | PRN
  Administered 2015-02-02: 80 ug via INTRAVENOUS
  Administered 2015-02-02 (×4): 40 ug via INTRAVENOUS

## 2015-02-02 MED ORDER — CEFAZOLIN SODIUM-DEXTROSE 2-3 GM-% IV SOLR
2.0000 g | Freq: Three times a day (TID) | INTRAVENOUS | Status: DC
  Administered 2015-02-02 – 2015-02-03 (×2): 2 g via INTRAVENOUS
  Filled 2015-02-02 (×5): qty 50

## 2015-02-02 MED ORDER — MEPERIDINE HCL 25 MG/ML IJ SOLN: 6.2500 mg | INTRAMUSCULAR | Status: DC | PRN

## 2015-02-02 MED ORDER — SIMETHICONE 80 MG PO CHEW: 40.0000 mg | CHEWABLE_TABLET | Freq: Four times a day (QID) | ORAL | Status: DC | PRN

## 2015-02-02 MED ORDER — NEOSTIGMINE METHYLSULFATE 10 MG/10ML IV SOLN
INTRAVENOUS | Status: AC
  Filled 2015-02-02: qty 1

## 2015-02-02 MED ORDER — ROCURONIUM BROMIDE 50 MG/5ML IV SOLN
INTRAVENOUS | Status: AC
  Filled 2015-02-02: qty 2

## 2015-02-02 MED ORDER — ONDANSETRON 4 MG PO TBDP: 4.0000 mg | ORAL_TABLET | Freq: Four times a day (QID) | ORAL | Status: DC | PRN

## 2015-02-02 MED ORDER — MIDAZOLAM HCL 5 MG/ML IJ SOLN: 2.0000 mg | Freq: Once | INTRAMUSCULAR | Status: DC

## 2015-02-02 MED ORDER — PROPOFOL 10 MG/ML IV BOLUS
INTRAVENOUS | Status: DC | PRN
  Administered 2015-02-02: 200 mg via INTRAVENOUS

## 2015-02-02 MED ORDER — ROCURONIUM BROMIDE 100 MG/10ML IV SOLN
INTRAVENOUS | Status: DC | PRN
  Administered 2015-02-02: 50 mg via INTRAVENOUS
  Administered 2015-02-02: 30 mg via INTRAVENOUS
  Administered 2015-02-02: 20 mg via INTRAVENOUS

## 2015-02-02 MED ORDER — FENTANYL CITRATE (PF) 100 MCG/2ML IJ SOLN
INTRAMUSCULAR | Status: DC | PRN
  Administered 2015-02-02 (×4): 50 ug via INTRAVENOUS
  Administered 2015-02-02: 100 ug via INTRAVENOUS
  Administered 2015-02-02: 150 ug via INTRAVENOUS
  Administered 2015-02-02: 50 ug via INTRAVENOUS

## 2015-02-02 MED ORDER — LACTATED RINGERS IV SOLN
INTRAVENOUS | Status: DC
  Administered 2015-02-02 (×4): via INTRAVENOUS

## 2015-02-02 MED ORDER — GLYCOPYRROLATE 0.2 MG/ML IJ SOLN
INTRAMUSCULAR | Status: DC | PRN
Start: 2015-02-02 — End: 2015-02-02
  Administered 2015-02-02: .5 mg via INTRAVENOUS

## 2015-02-02 MED ORDER — TECHNETIUM TC 99M SULFUR COLLOID FILTERED
1.0000 | Freq: Once | INTRAVENOUS | Status: AC | PRN
  Administered 2015-02-02: 1 via INTRADERMAL

## 2015-02-02 MED ORDER — METHYLENE BLUE 1 % INJ SOLN
INTRAMUSCULAR | Status: AC
  Filled 2015-02-02: qty 10

## 2015-02-02 MED ORDER — OXYCODONE HCL 5 MG PO TABS
ORAL_TABLET | ORAL | Status: AC
Start: 2015-02-02 — End: 2015-02-03
  Filled 2015-02-02: qty 2

## 2015-02-02 MED ORDER — ACETAMINOPHEN 325 MG PO TABS: 650.0000 mg | ORAL_TABLET | Freq: Four times a day (QID) | ORAL | Status: DC | PRN

## 2015-02-02 MED ORDER — ALPRAZOLAM 0.5 MG PO TABS
0.5000 mg | ORAL_TABLET | Freq: Two times a day (BID) | ORAL | Status: DC
  Administered 2015-02-03: 0.5 mg via ORAL
  Filled 2015-02-02: qty 1

## 2015-02-02 MED ORDER — ONDANSETRON HCL 4 MG/2ML IJ SOLN: 4.0000 mg | Freq: Four times a day (QID) | INTRAMUSCULAR | Status: DC | PRN

## 2015-02-02 MED ORDER — ONDANSETRON HCL 4 MG/2ML IJ SOLN
INTRAMUSCULAR | Status: DC | PRN
  Administered 2015-02-02: 4 mg via INTRAVENOUS

## 2015-02-02 MED ORDER — KETOROLAC TROMETHAMINE 30 MG/ML IJ SOLN
30.0000 mg | Freq: Three times a day (TID) | INTRAMUSCULAR | Status: DC
  Administered 2015-02-02 – 2015-02-03 (×2): 30 mg via INTRAVENOUS
  Filled 2015-02-02: qty 1

## 2015-02-02 MED ORDER — GENTAMICIN SULFATE 40 MG/ML IJ SOLN
INTRAMUSCULAR | Status: DC
  Filled 2015-02-02: qty 1

## 2015-02-02 MED ORDER — ROCURONIUM BROMIDE 50 MG/5ML IV SOLN
INTRAVENOUS | Status: AC
  Filled 2015-02-02: qty 1

## 2015-02-02 MED ORDER — METHOCARBAMOL 500 MG PO TABS
500.0000 mg | ORAL_TABLET | Freq: Four times a day (QID) | ORAL | Status: DC | PRN
  Administered 2015-02-02 – 2015-02-03 (×3): 500 mg via ORAL
  Filled 2015-02-02 (×3): qty 1

## 2015-02-02 MED ORDER — ONDANSETRON HCL 4 MG/2ML IJ SOLN: 4.0000 mg | Freq: Once | INTRAMUSCULAR | Status: DC | PRN

## 2015-02-02 MED ORDER — SODIUM CHLORIDE 0.9 % IV SOLN
INTRAVENOUS | Status: DC
  Administered 2015-02-02 – 2015-02-03 (×2): via INTRAVENOUS

## 2015-02-02 MED ORDER — 0.9 % SODIUM CHLORIDE (POUR BTL) OPTIME
TOPICAL | Status: DC | PRN
  Administered 2015-02-02: 2000 mL

## 2015-02-02 MED ORDER — KETOROLAC TROMETHAMINE 30 MG/ML IJ SOLN
INTRAMUSCULAR | Status: AC
  Administered 2015-02-02: 30 mg via INTRAVENOUS
  Filled 2015-02-02: qty 1

## 2015-02-02 MED ORDER — ALPRAZOLAM 0.5 MG PO TABS
0.5000 mg | ORAL_TABLET | Freq: Every evening | ORAL | Status: DC | PRN
  Administered 2015-02-02: 1 mg via ORAL
  Filled 2015-02-02: qty 2

## 2015-02-02 MED ORDER — LIDOCAINE-EPINEPHRINE (PF) 1.5 %-1:200000 IJ SOLN
INTRAMUSCULAR | Status: DC | PRN
  Administered 2015-02-02: 30 mL

## 2015-02-02 MED ORDER — MIDAZOLAM HCL 2 MG/2ML IJ SOLN
INTRAMUSCULAR | Status: AC
  Filled 2015-02-02: qty 2

## 2015-02-02 MED ORDER — SODIUM CHLORIDE 0.9 % IV SOLN
INTRAVENOUS | Status: DC | PRN
  Administered 2015-02-02: 1000 mL

## 2015-02-02 MED ORDER — HYDROMORPHONE HCL 1 MG/ML IJ SOLN
0.2500 mg | INTRAMUSCULAR | Status: DC | PRN
  Administered 2015-02-02 (×4): 0.5 mg via INTRAVENOUS

## 2015-02-02 MED ORDER — NEOSTIGMINE METHYLSULFATE 10 MG/10ML IV SOLN
INTRAVENOUS | Status: DC | PRN
  Administered 2015-02-02: 2.5 mg via INTRAVENOUS

## 2015-02-02 MED ORDER — LIDOCAINE-EPINEPHRINE (PF) 1.5 %-1:200000 IJ SOLN: INTRAMUSCULAR | Status: DC | PRN

## 2015-02-02 MED ORDER — MORPHINE SULFATE (PF) 2 MG/ML IV SOLN
2.0000 mg | INTRAVENOUS | Status: DC | PRN
  Administered 2015-02-02 – 2015-02-03 (×5): 2 mg via INTRAVENOUS
  Filled 2015-02-02 (×5): qty 1

## 2015-02-02 MED ORDER — GLYCOPYRROLATE 0.2 MG/ML IJ SOLN
INTRAMUSCULAR | Status: AC
  Filled 2015-02-02: qty 3

## 2015-02-02 MED ORDER — HYDROMORPHONE HCL 1 MG/ML IJ SOLN
INTRAMUSCULAR | Status: AC
  Administered 2015-02-02: 0.5 mg via INTRAVENOUS
  Filled 2015-02-02: qty 1

## 2015-02-02 MED ORDER — OXYCODONE HCL 5 MG PO TABS
5.0000 mg | ORAL_TABLET | ORAL | Status: DC | PRN
  Administered 2015-02-02 – 2015-02-03 (×3): 10 mg via ORAL
  Filled 2015-02-02 (×2): qty 2

## 2015-02-02 MED ORDER — PHENYLEPHRINE 40 MCG/ML (10ML) SYRINGE FOR IV PUSH (FOR BLOOD PRESSURE SUPPORT)
PREFILLED_SYRINGE | INTRAVENOUS | Status: AC
  Filled 2015-02-02: qty 10

## 2015-02-02 MED ORDER — DIPHENHYDRAMINE HCL 12.5 MG/5ML PO ELIX
12.5000 mg | ORAL_SOLUTION | Freq: Four times a day (QID) | ORAL | Status: DC | PRN
  Administered 2015-02-03: 12.5 mg via ORAL
  Filled 2015-02-02: qty 10

## 2015-02-02 MED ORDER — ARTIFICIAL TEARS OP OINT
TOPICAL_OINTMENT | OPHTHALMIC | Status: DC | PRN
  Administered 2015-02-02: 1 via OPHTHALMIC

## 2015-02-02 MED ORDER — ARTIFICIAL TEARS OP OINT
TOPICAL_OINTMENT | OPHTHALMIC | Status: AC
  Filled 2015-02-02: qty 3.5

## 2015-02-02 MED ORDER — ACETAMINOPHEN 650 MG RE SUPP: 650.0000 mg | Freq: Four times a day (QID) | RECTAL | Status: DC | PRN

## 2015-02-02 MED ORDER — LIDOCAINE HCL 4 % MT SOLN
OROMUCOSAL | Status: DC | PRN
  Administered 2015-02-02: 4 mL via TOPICAL

## 2015-02-02 MED ORDER — LIDOCAINE HCL (CARDIAC) 20 MG/ML IV SOLN
INTRAVENOUS | Status: AC
  Filled 2015-02-02: qty 5

## 2015-02-02 SURGICAL SUPPLY — 79 items
APPLIER CLIP 9.375 MED OPEN (MISCELLANEOUS)
BAG DECANTER FOR FLEXI CONT (MISCELLANEOUS) ×3 IMPLANT
BINDER BREAST LRG (GAUZE/BANDAGES/DRESSINGS) IMPLANT
BINDER BREAST XLRG (GAUZE/BANDAGES/DRESSINGS) IMPLANT
BNDG COHESIVE 4X5 TAN STRL (GAUZE/BANDAGES/DRESSINGS) ×3 IMPLANT
CANISTER SUCTION 2500CC (MISCELLANEOUS) ×6 IMPLANT
CHLORAPREP W/TINT 26ML (MISCELLANEOUS) ×6 IMPLANT
CLIP APPLIE 9.375 MED OPEN (MISCELLANEOUS) IMPLANT
CONT SPEC 4OZ CLIKSEAL STRL BL (MISCELLANEOUS) ×15 IMPLANT
COVER PROBE W GEL 5X96 (DRAPES) ×3 IMPLANT
COVER SURGICAL LIGHT HANDLE (MISCELLANEOUS) ×6 IMPLANT
DEVICE DISSECT PLASMABLAD 3.0S (MISCELLANEOUS) ×4 IMPLANT
DRAIN CHANNEL 15F RND FF W/TCR (WOUND CARE) IMPLANT
DRAIN CHANNEL 19F RND (DRAIN) ×6 IMPLANT
DRAPE LAPAROSCOPIC ABDOMINAL (DRAPES) ×3 IMPLANT
DRAPE ORTHO SPLIT 77X108 STRL (DRAPES) ×2
DRAPE PROXIMA HALF (DRAPES) IMPLANT
DRAPE SURG ORHT 6 SPLT 77X108 (DRAPES) ×4 IMPLANT
DRAPE WARM FLUID 44X44 (DRAPE) ×3 IMPLANT
DRSG PAD ABDOMINAL 8X10 ST (GAUZE/BANDAGES/DRESSINGS) ×6 IMPLANT
DRSG TEGADERM 2-3/8X2-3/4 SM (GAUZE/BANDAGES/DRESSINGS) ×3 IMPLANT
DRSG TEGADERM 4X4.75 (GAUZE/BANDAGES/DRESSINGS) ×6 IMPLANT
ELECT BLADE 4.0 EZ CLEAN MEGAD (MISCELLANEOUS) ×6
ELECT BLADE 6.5 EXT (BLADE) ×3 IMPLANT
ELECT CAUTERY BLADE 6.4 (BLADE) ×6 IMPLANT
ELECT COATED BLADE 2.86 ST (ELECTRODE) ×3 IMPLANT
ELECT REM PT RETURN 9FT ADLT (ELECTROSURGICAL) ×9
ELECTRODE BLDE 4.0 EZ CLN MEGD (MISCELLANEOUS) ×4 IMPLANT
ELECTRODE REM PT RTRN 9FT ADLT (ELECTROSURGICAL) ×6 IMPLANT
EVACUATOR SILICONE 100CC (DRAIN) ×9 IMPLANT
EXPANDER TISSUE MX 400CC (Breast) ×4 IMPLANT
GAUZE SPONGE 4X4 12PLY STRL (GAUZE/BANDAGES/DRESSINGS) ×6 IMPLANT
GLOVE BIO SURGEON STRL SZ 6 (GLOVE) ×9 IMPLANT
GLOVE BIO SURGEON STRL SZ 6.5 (GLOVE) ×3 IMPLANT
GLOVE BIO SURGEON STRL SZ7 (GLOVE) ×6 IMPLANT
GLOVE BIOGEL PI IND STRL 6.5 (GLOVE) ×4 IMPLANT
GLOVE BIOGEL PI IND STRL 7.5 (GLOVE) ×2 IMPLANT
GLOVE BIOGEL PI INDICATOR 6.5 (GLOVE) ×2
GLOVE BIOGEL PI INDICATOR 7.5 (GLOVE) ×1
GOWN STRL REUS W/ TWL LRG LVL3 (GOWN DISPOSABLE) ×10 IMPLANT
GOWN STRL REUS W/TWL LRG LVL3 (GOWN DISPOSABLE) ×5
GRAFT FLEX HD BILAT 4X16 THICK (Tissue Mesh) ×3 IMPLANT
KIT BASIN OR (CUSTOM PROCEDURE TRAY) ×6 IMPLANT
KIT MARKER MARGIN INK (KITS) ×3 IMPLANT
KIT ROOM TURNOVER OR (KITS) ×6 IMPLANT
LIGHT WAVEGUIDE WIDE FLAT (MISCELLANEOUS) ×6 IMPLANT
LIQUID BAND (GAUZE/BANDAGES/DRESSINGS) ×6 IMPLANT
MARKER SKIN DUAL TIP RULER LAB (MISCELLANEOUS) ×9 IMPLANT
NEEDLE 18GX1X1/2 (RX/OR ONLY) (NEEDLE) IMPLANT
NEEDLE HYPO 25GX1X1/2 BEV (NEEDLE) IMPLANT
NS IRRIG 1000ML POUR BTL (IV SOLUTION) ×9 IMPLANT
PACK GENERAL/GYN (CUSTOM PROCEDURE TRAY) ×6 IMPLANT
PAD ARMBOARD 7.5X6 YLW CONV (MISCELLANEOUS) ×6 IMPLANT
PIN SAFETY STERILE (MISCELLANEOUS) ×6 IMPLANT
PLASMABLADE 3.0S (MISCELLANEOUS) ×6
SET ASEPTIC TRANSFER (MISCELLANEOUS) ×6 IMPLANT
SOLUTION BETADINE 4OZ (MISCELLANEOUS) ×3 IMPLANT
SPECIMEN JAR X LARGE (MISCELLANEOUS) ×3 IMPLANT
SPONGE LAP 18X18 X RAY DECT (DISPOSABLE) ×3 IMPLANT
STAPLER VISISTAT 35W (STAPLE) ×6 IMPLANT
STRIP CLOSURE SKIN 1/2X4 (GAUZE/BANDAGES/DRESSINGS) ×3 IMPLANT
SUT ETHILON 2 0 FS 18 (SUTURE) ×9 IMPLANT
SUT MNCRL AB 4-0 PS2 18 (SUTURE) ×15 IMPLANT
SUT SILK 2 0 SH (SUTURE) IMPLANT
SUT VIC AB 3-0 54X BRD REEL (SUTURE) ×2 IMPLANT
SUT VIC AB 3-0 BRD 54 (SUTURE) ×1
SUT VIC AB 3-0 SH 18 (SUTURE) ×3 IMPLANT
SUT VIC AB 3-0 SH 27 (SUTURE) ×6
SUT VIC AB 3-0 SH 27X BRD (SUTURE) ×12 IMPLANT
SUT VIC AB 4-0 PS2 27 (SUTURE) IMPLANT
SUT VICRYL 4-0 PS2 18IN ABS (SUTURE) IMPLANT
SYR BULB IRRIGATION 50ML (SYRINGE) ×6 IMPLANT
SYR CONTROL 10ML LL (SYRINGE) IMPLANT
TISSUE EXPANDER MX 400CC (Breast) ×6 IMPLANT
TOWEL OR 17X24 6PK STRL BLUE (TOWEL DISPOSABLE) ×6 IMPLANT
TOWEL OR 17X26 10 PK STRL BLUE (TOWEL DISPOSABLE) ×6 IMPLANT
TRAY FOLEY CATH 16FRSI W/METER (SET/KITS/TRAYS/PACK) ×3 IMPLANT
TUBE CONNECTING 12X1/4 (SUCTIONS) ×6 IMPLANT
TUBING BULK SUCTION (MISCELLANEOUS) ×3 IMPLANT

## 2015-02-02 NOTE — Transfer of Care (Signed)
Immediate Anesthesia Transfer of Care Note  Patient: Kathy Golden  Procedure(s) Performed: Procedure(s): BILATERAL NIPPLE SPARING MASTECTOMY WITH RIGHT SENTINAL LYMPH NODE BIOPSY  (Bilateral) BILATERAL BREAST RECONSTRUCTION WITH PLACEMENT OF TISSUE EXPANDER AND POSSIBLE ACELLULAR DERMIS (Bilateral)  Patient Location: PACU  Anesthesia Type:GA combined with regional for post-op pain  Level of Consciousness: awake  Airway & Oxygen Therapy: Patient Spontanous Breathing and Patient connected to nasal cannula oxygen  Post-op Assessment: Report given to RN and Post -op Vital signs reviewed and stable  Post vital signs: stable  Last Vitals:  Filed Vitals:   02/02/15 1256 02/02/15 1257  BP:    Pulse: 84 82  Temp:    Resp: 19 18    Complications: No apparent anesthesia complications

## 2015-02-02 NOTE — Anesthesia Postprocedure Evaluation (Signed)
Anesthesia Post Note  Patient: Kathy Golden  Procedure(s) Performed: Procedure(s) (LRB): BILATERAL NIPPLE SPARING MASTECTOMY WITH RIGHT SENTINAL LYMPH NODE BIOPSY  (Bilateral) BILATERAL BREAST RECONSTRUCTION WITH PLACEMENT OF TISSUE EXPANDER AND POSSIBLE ACELLULAR DERMIS (Bilateral)  Patient location during evaluation: PACU Anesthesia Type: General Level of consciousness: awake and alert Pain management: pain level controlled Vital Signs Assessment: post-procedure vital signs reviewed and stable Respiratory status: spontaneous breathing, nonlabored ventilation, respiratory function stable and patient connected to nasal cannula oxygen Cardiovascular status: blood pressure returned to baseline and stable Postop Assessment: no signs of nausea or vomiting Anesthetic complications: no    Last Vitals:  Filed Vitals:   02/02/15 1923 02/02/15 1930  BP: 105/84   Pulse:  73  Temp:  36.7 C  Resp:  20    Last Pain:  Filed Vitals:   02/02/15 1935  PainSc: 3                  Zenaida Deed

## 2015-02-02 NOTE — Interval H&P Note (Signed)
History and Physical Interval Note:  02/02/2015 12:42 PM  Kathy Golden  has presented today for surgery, with the diagnosis of RIGHT BREAST CANCER  The various methods of treatment have been discussed with the patient and family. After consideration of risks, benefits and other options for treatment, the patient has consented to  Procedure(s): BILATERAL NIPPLE SPARING MASTECTOMY WITH RIGHT SENTINAL LYMPH NODE BIOPSY  (Bilateral) BILATERAL BREAST RECONSTRUCTION WITH PLACEMENT OF TISSUE EXPANDER AND POSSIBLE ACELLULAR DERMIS (Bilateral) as a surgical intervention .  The patient's history has been reviewed, patient examined, no change in status, stable for surgery.  I have reviewed the patient's chart and labs.  Questions were answered to the patient's satisfaction.     Ronith Berti

## 2015-02-02 NOTE — H&P (Signed)
44 yof who presents with no prior breast history after undergoing screening mm that shows an area of 8x9x3 mm calcifications. she has fh of mgm at age 44. she is a smoker. she works as a Theme park manager. she has no complaints about either breast. core biopsy was done and shows int to high grade dcis that is 95% er pos and 80% pr pos. she is here today to discuss options.   Problem List/Past Medical  ANXIETY (F41.9)  Other Problems Anxiety Disorder Breast Cancer Depression Hemorrhoids High blood pressure Lump In Breast Umbilical Hernia Repair  Past Surgical History  None10/26/2016 (Marked as Inactive) Breast Biopsy Right. Cesarean Section - Multiple Oral Surgery Ventral / Umbilical Hernia Surgery Bilateral.  Diagnostic Studies History  Colonoscopy never Mammogram within last year Pap Smear 1-5 years ago  Allergies  No Known Drug Allergies10/26/2016  Medication History  No Current Medications Medications Reconciled  Social History  Caffeine use Coffee. No alcohol use No drug use Tobacco use Current some day smoker.  Family History  Cancer Family Members In General. Heart Disease Brother, Father. Heart disease in female family member before age 74 Thyroid problems Family Members In General, Sister.  Pregnancy / Birth History Age at menarche 38 years. Contraceptive History Depo-provera, Oral contraceptives. Gravida 3 Maternal age 56-30 Para 2 Regular periods   Review of Systems  General Present- Night Sweats and Weight Loss. Not Present- Appetite Loss, Chills, Fatigue, Fever and Weight Gain. Skin Present- Change in Wart/Mole. Not Present- Dryness, Hives, Jaundice, New Lesions, Non-Healing Wounds, Rash and Ulcer. HEENT Present- Wears glasses/contact lenses. Not Present- Earache, Hearing Loss, Hoarseness, Nose Bleed, Oral Ulcers, Ringing in the Ears, Seasonal Allergies, Sinus Pain, Sore Throat, Visual Disturbances and Yellow  Eyes. Respiratory Not Present- Bloody sputum, Chronic Cough, Difficulty Breathing, Snoring and Wheezing. Breast Not Present- Breast Mass, Breast Pain, Nipple Discharge and Skin Changes. Cardiovascular Not Present- Chest Pain, Difficulty Breathing Lying Down, Leg Cramps, Palpitations, Rapid Heart Rate, Shortness of Breath and Swelling of Extremities. Gastrointestinal Not Present- Abdominal Pain, Bloating, Bloody Stool, Change in Bowel Habits, Chronic diarrhea, Constipation, Difficulty Swallowing, Excessive gas, Gets full quickly at meals, Hemorrhoids, Indigestion, Nausea, Rectal Pain and Vomiting. Female Genitourinary Not Present- Frequency, Nocturia, Painful Urination, Pelvic Pain and Urgency. Musculoskeletal Not Present- Back Pain, Joint Pain, Joint Stiffness, Muscle Pain, Muscle Weakness and Swelling of Extremities. Neurological Not Present- Decreased Memory, Fainting, Headaches, Numbness, Seizures, Tingling, Tremor, Trouble walking and Weakness. Psychiatric Present- Anxiety. Not Present- Bipolar, Change in Sleep Pattern, Depression, Fearful and Frequent crying. Endocrine Not Present- Cold Intolerance, Excessive Hunger, Hair Changes, Heat Intolerance, Hot flashes and New Diabetes. Hematology Not Present- Easy Bruising, Excessive bleeding, Gland problems, HIV and Persistent Infections.   Physical Exam  General Mental Status-Alert. Chest and Lung Exam Chest and lung exam reveals -on auscultation, normal breath sounds, no adventitious sounds and normal vocal resonance. Breast Nipples-No Discharge. Breast Lump-No Palpable Breast Mass. Cardiovascular Cardiovascular examination reveals -normal heart sounds, regular rate and rhythm with no murmurs. Lymphatic Head & Neck General Head & Neck Lymphatics: Bilateral - Description - Normal. Axillary General Axillary Region: Bilateral - Description - Normal.  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 We discussed all of the different options for treatment for breast cancer including surgery, chemotherapy, radiation therapy, Herceptin, and antiestrogen therapy. We discussed  a sentinel lymph node biopsy only if performing a mastectomy. we discussed small risk of lymphedema with sn biopsy as well. I think functionally for her this certainly could have impact given her profession. We discussed the options for treatment of the breast cancer which included lumpectomy versus a mastectomy. We discussed the performance of the lumpectomy with radioactive seed placement. We discussed a 5% chance of a positive margin requiring reexcision in the operating room. We also discussed that she will likely need radiation therapy (this is usually 5-7 weeks) if she undergoes lumpectomy. The breast cannot undergo more radiation therapy in the same breast after lumpectomy in the future. We discussed the mastectomy (removal of whole breast) and the postoperative care for that as well. Mastectomy can be followed by reconstruction. This is a more extensive surgery and requires more recovery. The decision for lumpectomy vs mastectomy has no impact on decision for chemotherapy. Most mastectomy patients will not need radiation therapy. We discussed that there is no difference in her survival whether she undergoes lumpectomy with radiation therapy or antiestrogen therapy versus a mastectomy. I think waiting for genetic testing and then final plan would be appropriate. I have counselled her that in absence of genetic mutation there is no advantage to mastectomy or indication for preventive surgery.   addendum Right nsm, right axillary sn biopsy, left prophylactic nsm she has stopped smoking. I told her we would need to wait at least four weeks prior to  surgery. I again told her that I do not medically recommend mastectomy or proph mastectomy. due to age, need for further mm, concern about recurrence, not desiring radiation she would like to proceed with bilateral nsm. I specifically discussed risks of this including bleeding, infection, skin loss, nipple/areolar death, nac sloughing, loss of nac sensation, cancer found in nipple specimen necessitating excision, small chance of lymphedema. she understands all of this as well as time to recover and no medical benefit and would like to proceed

## 2015-02-02 NOTE — Op Note (Signed)
Operative Note   DATE OF OPERATION: 12.9.16  LOCATION: [facility]- observation  SURGICAL DIVISION: Plastic Surgery  PREOPERATIVE DIAGNOSES:  1. Right breast DCIS  POSTOPERATIVE DIAGNOSES:  same  PROCEDURE:  1. Bilateral breast reconstruction with tissue expanders 2. Acellular dermis (Flex HD) for breast reconstruction total 100 cm2  SURGEON: Irene Limbo MD MBA  ASSISTANT: none  ANESTHESIA:  General.   EBL: 50 ml for entire case  COMPLICATIONS: None immediate.   INDICATIONS FOR PROCEDURE:  The patient, Kathy Golden, is a 44 y.o. female born on 03-21-1970, is here for immediate expander based reconstruction following nipple sparing mastectomies.   FINDINGS: Natrelle 400 ml 133MX-12-T placed bilaterally. Initial fill volume 180 ml bilateral. Reef 133MX-12-T RIGHT [place] [id number] LEFT [id number]  DESCRIPTION OF PROCEDURE:  The patient was marked with the patient in the preoperative area to mark sternal notch, chest midline, anterior axillary lines and inframammary folds. The patient was taken to the operating room. SCDs were placed and IV antibiotics were given. The patient's operative site was prepped and draped in a sterile fashion. A time out was performed and all information was confirmed to be correct. Following completion of mastectomies, reconstruction began on left side. The inferior insertions of pectoralis major muscle were elevated continuous with abdominal wall fascia. Submuscular dissection completed toward clavicle. The anterior rectus fascia was elevated 1-2 cm below inframammary fold. Flex HD was perforated and sewn to inferior border of pectoralis major with running 3-0 vicryl. A 19 Fr drain was placed in subcutaneous position laterally and submuscular position medialy and secured to skin with 2-0 nylon. The cavity was irrigated with solution containing Ancef, genatmicin, and bacitracin. Hemostasis was ensured. The tissue expander was prepared and placed in submuscular  position. The expander was secured to chest wall with a 3-0 vicryl. The inferior border of the acellular dermis was inset to elevated abdominal wall fascia inferiorly and laterally was secured to serratus fascia. The incision was closed with 3-0 vicryl in fascial layer and 4-0 vicryl in dermis. Skin closure completed with 4-0 monocryl subcuticular and tissue adhesive. Over right breast, similarly the inferior pectoralis insertions were divided and anterior rectus fascia elevated for 1-2 cm below inframammary fold. The acellular dermis was prepared and secured to inferior border of pectoralis with 3-0 vicryl. 19 Fr drain placed in cavity prior to placement of expander. The inferior and lateral borders of acellular dermis secured with abdominal wall fascia and chest wall. Closure completed in similar fashion. The ports were accessed and filled to 180 ml bilaterally. The patient was brought to upright position and the skin flaps were redraped so that NAC was symmetric from the sternal notch and midline. Transparent, adherent dressings applied. Dry dressing and breast binder applied.  The patient was allowed to wake from anesthesia, extubated and taken to the recovery room in satisfactory condition.   SPECIMENS: none  DRAINS: 19 Fr JP in right and left reconstructed breast  Irene Limbo, MD [facility] [phone]

## 2015-02-02 NOTE — Op Note (Signed)
Preoperative diagnosis: left breast clinical stage 0 cancer Postoperative diagnosis: same as above Procedure: 1. Right prophylactic nipple sparing mastectomy 2. Left nipple sparing mastectomy 3. Left axillary sentinel node biopsy Surgeon: Dr Serita Grammes Asst: Dr Irene Limbo Anesthesia: general with pec block bilaterally EBL: 50 cc Drains per plastic surgery Complications none Specimens:  1. Right nsm marked short superior, long lateral, double na margin 2. Right nipple margin 3. Left nsm marked same way 4. Left nipple margin 5. Left axillary sentinel node with highest count of 289 Disposition case turned over to plastic surgery  Indications: This is a 45 yof with small area of dcis. She adamantly desires bilateral mastectomies.   Plan for bilateral nsm with left axillary sentinel node biopsy with immediate expander reconstruction.   Procedure: After informed consent was obtained she first underwent injection of technetium in the standard periareolar fashion. She also underwent a pectoral block. She was given antibiotics. She had SCDs in place. She was placed under general anesthesia without complication. She was then prepped and draped in the standard sterile surgical fashion. A surgical timeout was then performed.  I first performed the right nsm. I measured a 12 cm incision out 8 cm from xyphoid. This was made in the inframammary fold. I then used cautery to do the posterior plane removing the breast and the fascia from the pectoralis muscle to the parasternal region, clavicle and the latissimus. I then created the anterior flap.The breast tissue was then removed. I removed the nipple margin as a separate specimen. Hemostasis was obtained. The flaps were viable and all breast tissue had been removed. We then packed this and moved to the other side.  There was activity in the axilla from the neoprobe. I made a similar incision in the IM fold. I created flaps as above.. The  breast tissue was removed in its entirety. I then removed the nipple margin separately. Hemostasis was obtained. I then used the neoprobe to identify the sentinel node with counts above. I then turned the case over to Dr Iran Planas for reconstruction

## 2015-02-02 NOTE — Anesthesia Procedure Notes (Addendum)
Anesthesia Regional Block:  Pectoralis block  Pre-Anesthetic Checklist: ,, timeout performed, Correct Patient, Correct Site, Correct Laterality, Correct Procedure, Correct Position, site marked, Risks and benefits discussed,  Surgical consent,  Pre-op evaluation,  At surgeon's request and post-op pain management  Laterality: Right and Left  Prep: chloraprep       Needles:  Injection technique: Single-shot     Needle Length: 9cm 9 cm Needle Gauge: 21 and 21 G    Additional Needles:  Procedures: ultrasound guided (picture in chart) Pectoralis block Narrative:  Start time: 02/02/2015 11:30 AM End time: 02/02/2015 11:45 AM Injection made incrementally with aspirations every 5 mL.  Performed by: Personally  Anesthesiologist: Lillia Abed  Additional Notes: Monitors applied. Patient sedated. Sterile prep and drape,hand hygiene and sterile gloves were used. Relevant anatomy identified.Needle position confirmed.Local anesthetic injected incrementally after negative aspiration. Local anesthetic spread visualized. Vascular puncture avoided. Bilateral PEC blocks done.  No complications. Image printed for medical record.The patient tolerated the procedure well.       Procedure Name: Intubation Performed by: Judeth Cornfield T Pre-anesthesia Checklist: Patient identified, Timeout performed, Emergency Drugs available, Suction available and Patient being monitored Patient Re-evaluated:Patient Re-evaluated prior to inductionOxygen Delivery Method: Circle system utilized Preoxygenation: Pre-oxygenation with 100% oxygen Intubation Type: IV induction Ventilation: Mask ventilation without difficulty Laryngoscope Size: Mac and 3 Grade View: Grade I Tube type: Oral Tube size: 7.0 mm Number of attempts: 1 Placement Confirmation: ETT inserted through vocal cords under direct vision,  breath sounds checked- equal and bilateral and positive ETCO2 Secured at: 22 cm Tube secured with:  Tape Dental Injury: Teeth and Oropharynx as per pre-operative assessment

## 2015-02-02 NOTE — Anesthesia Preprocedure Evaluation (Signed)
Anesthesia Evaluation  Patient identified by MRN, date of birth, ID band Patient awake    Reviewed: Allergy & Precautions, NPO status , Patient's Chart, lab work & pertinent test results  Airway Mallampati: I  TM Distance: >3 FB Neck ROM: Full    Dental   Pulmonary former smoker,    Pulmonary exam normal       Cardiovascular Normal cardiovascular exam    Neuro/Psych    GI/Hepatic   Endo/Other    Renal/GU      Musculoskeletal   Abdominal   Peds  Hematology   Anesthesia Other Findings   Reproductive/Obstetrics                             Anesthesia Physical Anesthesia Plan  ASA: II  Anesthesia Plan: General   Post-op Pain Management: GA combined w/ Regional for post-op pain   Induction: Intravenous  Airway Management Planned: Oral ETT  Additional Equipment:   Intra-op Plan:   Post-operative Plan: Extubation in OR  Informed Consent: I have reviewed the patients History and Physical, chart, labs and discussed the procedure including the risks, benefits and alternatives for the proposed anesthesia with the patient or authorized representative who has indicated his/her understanding and acceptance.     Plan Discussed with: CRNA and Surgeon  Anesthesia Plan Comments:         Anesthesia Quick Evaluation  

## 2015-02-02 NOTE — Interval H&P Note (Signed)
History and Physical Interval Note:  02/02/2015 7:00 AM  Kathy Golden  has presented today for surgery, with the diagnosis of RIGHT BREAST CANCER  The various methods of treatment have been discussed with the patient and family. After consideration of risks, benefits and other options for treatment, the patient has consented to  Procedure(s): BILATERAL NIPPLE SPARING MASTECTOMY WITH RIGHT SENTINAL LYMPH NODE BIOPSY  (Bilateral) BILATERAL BREAST RECONSTRUCTION WITH PLACEMENT OF TISSUE EXPANDER AND POSSIBLE ACELLULAR DERMIS (Bilateral) as a surgical intervention .  The patient's history has been reviewed, patient examined, no change in status, stable for surgery.  I have reviewed the patient's chart and labs.  Questions were answered to the patient's satisfaction.     Kathy Golden

## 2015-02-03 ENCOUNTER — Telehealth: Payer: Self-pay | Admitting: General Surgery

## 2015-02-03 DIAGNOSIS — D0511 Intraductal carcinoma in situ of right breast: Secondary | ICD-10-CM | POA: Diagnosis not present

## 2015-02-03 MED ORDER — DOCUSATE SODIUM 100 MG PO CAPS
100.0000 mg | ORAL_CAPSULE | Freq: Two times a day (BID) | ORAL | Status: DC
  Administered 2015-02-03: 100 mg via ORAL
  Filled 2015-02-03: qty 1

## 2015-02-03 MED ORDER — METHOCARBAMOL 500 MG PO TABS: 500.0000 mg | ORAL_TABLET | Freq: Four times a day (QID) | ORAL | Status: DC | PRN

## 2015-02-03 MED ORDER — OXYCODONE HCL 5 MG PO TABS: 5.0000 mg | ORAL_TABLET | ORAL | Status: DC | PRN

## 2015-02-03 MED ORDER — SULFAMETHOXAZOLE-TRIMETHOPRIM 800-160 MG PO TABS: 1.0000 | ORAL_TABLET | Freq: Two times a day (BID) | ORAL | Status: DC

## 2015-02-03 NOTE — Telephone Encounter (Signed)
Pt's sister called stating pt has had insomnia since surgery yesterday.  She has tried benadryl and xanax with no results.  She thinks it is a reaction to one of her medications and would like something different.  I told them I would let Dr Donne Hazel know and have someone from our office call on Mon to check on her.  In the meantime they will keep trying different med combinations to help identify the cause.

## 2015-02-03 NOTE — Discharge Instructions (Signed)
[place] [facility], [place] [phone]  MASTECTOMY: POST OP INSTRUCTIONS  Always review your discharge instruction sheet given to you by the facility where your surgery was performed. IF YOU HAVE DISABILITY OR FAMILY LEAVE FORMS, YOU MUST BRING THEM TO THE OFFICE FOR PROCESSING.   DO NOT GIVE THEM TO YOUR DOCTOR. A prescription for pain medication may be given to you upon discharge.  Take your pain medication as prescribed, if needed.  If narcotic pain medicine is not needed, then you may take acetaminophen (Tylenol), naprosyn (Alleve) or ibuprofen (Advil) as needed. 1. Take your usually prescribed medications unless otherwise directed. 2. If you need a refill on your pain medication, please contact your pharmacy.  They will contact our office to request authorization.  Prescriptions will not be filled after 5pm or on week-ends. 3. You should follow a light diet the first few days after arrival home, such as soup and crackers, etc.  Resume your normal diet the day after surgery. 4. Most patients will experience some swelling and bruising on the chest and underarm.  Ice packs will help.  Swelling and bruising can take several days to resolve. Wear the binder day and night until you return to the office.  5. It is common to experience some constipation if taking pain medication after surgery.  Increasing fluid intake and taking a stool softener (such as Colace) will usually help or prevent this problem from occurring.  A mild laxative (Milk of Magnesia or Miralax) should be taken according to package instructions if there are no bowel movements after 48 hours. 6. DRAINS:  If you have drains in place, it is important to keep a list of the amount of drainage produced each day in your drains.  Before leaving the hospital, you should be instructed on drain care.  Call our office if you have any questions about your drains. I will remove your drains when they put out less than 30 cc or ml for 2  consecutive days. 7. ACTIVITIES:  You may resume regular (light) daily activities beginning the next day--such as daily self-care, walking, climbing stairs--gradually increasing activities as tolerated.  You may have sexual intercourse when it is comfortable.  Refrain from any heavy lifting or straining until approved by your doctor. a. You may drive when you are no longer taking prescription pain medication, you can comfortably wear a seatbelt, and you can safely maneuver your car and apply brakes. b. RETURN TO WORK:  __________________________________________________________ 8. You should see your doctor in the office for a follow-up appointment approximately 3-5 days after your surgery.  Your doctors nurse will typically make your follow-up appointment when she calls you with your pathology report.  Expect your pathology report 3-4business days after surgery. 9. OTHER INSTRUCTIONS: ______________________________________________________________________________________________ ____________________________________________________________________________________________ WHEN TO CALL YOUR DR Harjit Douds: 1. Fever over 101.0 2. Nausea and/or vomiting 3. Extreme swelling or bruising 4. Continued bleeding from incision. 5. Increased pain, redness, or drainage from the incision. The clinic staff is available to answer your questions during regular business hours.  Please dont hesitate to call and ask to speak to one of the nurses for clinical concerns.  If you have a medical emergency, go to the nearest emergency room or call 911.  A surgeon from [facility] is always on call at the hospital. [address], [place], [place], [place]  [address] ? [address], [place],    [address] [phone] ? [phone] ? FAX [phone] Web site: [email or web address]

## 2015-02-03 NOTE — Discharge Summary (Signed)
Physician Discharge Summary  Patient ID: Kathy Golden MRN: [id number] DOB/AGE: 46-Aug-1972 46 y.o.  Admit date: 02/02/2015 Discharge date: 02/03/2015  Admission Diagnoses: Right breast dcis  Discharge Diagnoses:  Active Problems:   DCIS (ductal carcinoma in situ) of breast   Discharged Condition: good  Hospital Course: 44 yof who underwent bilateral nsm with right ax sn biopsy for right sided dcis. She is doing well and will be discharged.   Consults: None  Significant Diagnostic Studies: none  Treatments: surgery  Discharge Exam: Blood pressure 108/62, pulse 62, temperature 98.1 F (36.7 C), temperature source Oral, resp. rate 18, height 5\' 3"  (1.6 m), weight 70.8 kg (156 lb 1.4 oz), last menstrual period 01/15/2015, SpO2 98 %. Lungs clear cv rrr Bilateral breast skin viable, no hematoma, nac viable bilaterally, drains serosang Disposition:   Discharge Instructions    Call MD for:  redness, tenderness, or signs of infection (pain, swelling, bleeding, redness, odor or green/yellow discharge around incision site)    Complete by:  As directed      Call MD for:  temperature >100.5    Complete by:  As directed      Discharge instructions    Complete by:  As directed   May remove dressings and shower 02/04/15. Soap and water ok. Pat Tegaderms dry. Breast binder or soft compression bra all other times.  Do not let drains dangle in shower. Place on laynard or similar.  Strip and record drains twice daily and bring log to clinic visit.  No strenuous activity, exercise, housework.   Ok to raise arms above shoulders for bathing and dressing.     Driving Restrictions    Complete by:  As directed   No driving while taking narcotics     Lifting restrictions    Complete by:  As directed   No lifting greater than 5 lbs     Resume previous diet    Complete by:  As directed             Medication List    TAKE these medications        ALPRAZolam 1 MG tablet  Commonly  known as:  XANAX  Take 0.5-1 mg by mouth 3 (three) times daily. 0.5mg  twice daily as needed and 0.5mg -1mg  at bedtime as needed     citalopram 20 MG tablet  Commonly known as:  CELEXA  Take 20 mg by mouth daily.     methocarbamol 500 MG tablet  Commonly known as:  ROBAXIN  Take 1 tablet (500 mg total) by mouth every 6 (six) hours as needed for muscle spasms.     oxyCODONE 5 MG immediate release tablet  Commonly known as:  Oxy IR/ROXICODONE  Take 1-2 tablets (5-10 mg total) by mouth every 4 (four) hours as needed for moderate pain.     sulfamethoxazole-trimethoprim 800-160 MG tablet  Commonly known as:  BACTRIM DS,SEPTRA DS  Take 1 tablet by mouth 2 (two) times daily.           Follow-up Information    Follow up with [facility], MD In 2 weeks.   Specialty:  General Surgery   Contact information:   [address] [place] [address] [phone]       Follow up with [facility], Arnoldo Hooker, MD In 1 week.   Specialty:  Plastic Surgery   Why:  as scheduled   Contact information:   [facility] [place] [address] [phone]  SignedRolm Bookbinder 02/03/2015, 7:38 AM

## 2015-02-03 NOTE — Progress Notes (Signed)
Pt ready for DC.  Follow up appts given and explained.  Rx s given and explained to pt.  Arm precautions explained to pt and ABC class once approved by Dr. Donne Hazel.  Pink breast bag given and contents explained.  JP measuring container given and explained.  Pt/sister know how to strip and drain the JPs and measure.

## 2015-02-05 ENCOUNTER — Encounter (HOSPITAL_COMMUNITY): Payer: Self-pay | Admitting: General Surgery

## 2015-02-06 ENCOUNTER — Other Ambulatory Visit: Payer: Self-pay | Admitting: General Surgery

## 2015-02-08 ENCOUNTER — Telehealth: Payer: Self-pay | Admitting: *Deleted

## 2015-02-08 DIAGNOSIS — C50411 Malignant neoplasm of upper-outer quadrant of right female breast: Secondary | ICD-10-CM

## 2015-02-08 NOTE — Telephone Encounter (Signed)
Called pt to assess needs after sx. Relate doing well and without complaints. Discussed survivorship program and referral. Confirmed f/u appt with Dr. Burr Medico on 03/01/15. Encourage pt to call with questions or needs. Received verbal understanding.

## 2015-02-09 ENCOUNTER — Telehealth: Payer: Self-pay | Admitting: Hematology

## 2015-02-09 ENCOUNTER — Other Ambulatory Visit: Payer: Self-pay | Admitting: Hematology

## 2015-02-09 NOTE — Telephone Encounter (Signed)
Called and left a message that we cancelled her 1/5 per pof from dr Burr Medico and added survivorship per dawn pof  anne

## 2015-03-01 ENCOUNTER — Ambulatory Visit: Payer: BLUE CROSS/BLUE SHIELD | Admitting: Hematology

## 2015-03-05 ENCOUNTER — Ambulatory Visit: Payer: BLUE CROSS/BLUE SHIELD | Admitting: Physical Therapy

## 2015-03-06 ENCOUNTER — Ambulatory Visit: Payer: BLUE CROSS/BLUE SHIELD | Attending: Plastic Surgery | Admitting: Physical Therapy

## 2015-03-06 DIAGNOSIS — M25612 Stiffness of left shoulder, not elsewhere classified: Secondary | ICD-10-CM

## 2015-03-06 DIAGNOSIS — Z9189 Other specified personal risk factors, not elsewhere classified: Secondary | ICD-10-CM

## 2015-03-06 NOTE — Therapy (Signed)
[place], [place], [address] Phone: [phone]   Fax:  [phone]  Physical Therapy Evaluation  Patient Details  Name: Kathy Golden MRN: [id number] Date of Birth: 11/05/1970 Referring Provider: Dr. Iran Planas   Encounter Date: 03/06/2015      PT End of Session - 03/06/15 1445    Visit Number 1   Number of Visits 5   Date for PT Re-Evaluation 04/06/15   PT Start Time [id number]   PT Stop Time 1430   PT Time Calculation (min) 45 min   Activity Tolerance Patient tolerated treatment well   Behavior During Therapy [facility] for tasks assessed/performed      Past Medical History  Diagnosis Date  . Breast cancer of upper-outer quadrant of right female breast ([place]) 12/13/2014  . Anxiety   . Depression   . Hot flashes   . Hemorrhoid     external    Past Surgical History  Procedure Laterality Date  . Cesarean section      x2  . Umbilical hernia repair    . Carpal tunnel release Right   . Nipple sparing mastectomy/sentinal lymph node biopsy/reconstruction/placement of tissue expander Bilateral 02/02/2015    Procedure: BILATERAL NIPPLE SPARING MASTECTOMY WITH RIGHT SENTINAL LYMPH NODE BIOPSY ;  Surgeon: Rolm Bookbinder, MD;  Location: [facility];  Service: General;  Laterality: Bilateral;  . Breast reconstruction with placement of tissue expander and flex hd (acellular hydrated dermis) Bilateral 02/02/2015    Procedure: BILATERAL BREAST RECONSTRUCTION WITH PLACEMENT OF TISSUE EXPANDER AND POSSIBLE ACELLULAR DERMIS;  Surgeon: Irene Limbo, MD;  Location: [facility];  Service: Plastics;  Laterality: Bilateral;    There were no vitals filed for this visit.  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  (3 hours)    Pertinent History double mastectomy with immedicate expander placement on Dec.9 one sentinel node from right side and none on the left  side. Has not had chemotherapy and will not need to have it and will not need radiaiton She plans to have implant surgery in March    Patient Stated Goals to get the tightness from her shoulders    Currently in Pain? Yes   Pain Score 8    Pain Location Axilla   Pain Orientation Left   Pain Descriptors / Indicators Tightness   Pain Radiating Towards Dellia Beckwith PT Assessment - 03/06/15 0001    Assessment   Medical Diagnosis breast caner on the right    Referring Provider Dr. Iran Planas    Onset Date/Surgical Date 02/02/15   Hand Dominance Right   Precautions   Precautions --  none   Precaution Comments just released from dr restrictions    Restrictions   Weight Bearing Restrictions No   Balance Screen   Has the patient fallen in the past 6 months No   Has the patient had a decrease in activity level because of a fear of falling?  No   Is the patient reluctant to leave their home because of a fear of falling?  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    Leisure not currently exerciser,    Cognition   Overall Cognitive Status Within Functional Limits for tasks assessed   Observation/Other Assessments   Observations healing well with small scabs under left breast and at right nipple area  assymetrical scapular movment   Skin Integrity no open area    Sensation   Light Touch Appears Intact   Coordination   Gross Motor Movements are Fluid and Coordinated Yes   AROM   Right Shoulder Flexion 149 Degrees   Right Shoulder ABduction 170 Degrees    Right Shoulder External Rotation 90 Degrees   Left Shoulder Flexion 152 Degrees   Left Shoulder ABduction 153 Degrees   Left Shoulder External Rotation 90 Degrees   Strength   Overall Strength Within functional limits for tasks performed   Right Shoulder Flexion 4/5   Right Shoulder ABduction 4/5   Right Shoulder External Rotation 4/5   Left Shoulder Flexion 4/5   Left Shoulder ABduction 4/5   Left Shoulder External Rotation 4/5   Palpation   Palpation comment tenderness and firmness at left pec major muscle.  Pt says she fells that the expander on the left side may be more under her arm? and just feels tight all around            LYMPHEDEMA/ONCOLOGY QUESTIONNAIRE - 03/06/15 1417    Right Upper Extremity Lymphedema   10 cm Proximal to Olecranon Process 28 cm   Olecranon Process 24.5 cm   10 cm Proximal to Ulnar Styloid Process 22.5 cm   Just Proximal to Ulnar Styloid Process 15 cm   Across Hand at [employer] 19 cm   At [place] of 2nd Digit 6 cm   Left Upper Extremity Lymphedema   10 cm Proximal to Olecranon Process 27 cm   Olecranon Process 24 cm   10 cm Proximal to Ulnar Styloid Process 21.1 cm   Just Proximal to Ulnar Styloid Process 15 cm   Across Hand at [employer] 19 cm   At [place] of 2nd Digit 6 cm           Quick Dash - 03/06/15 0001    Open a tight or new jar Moderate difficulty   Do heavy household chores (wash walls, wash floors) Moderate difficulty   Carry a shopping bag or briefcase Mild difficulty   Wash your back Moderate difficulty   Use a knife to cut food No difficulty   Recreational activities in which you take some force or impact through your arm, shoulder, or hand (golf, hammering, tennis) Severe difficulty   During the past week, to what extent has your arm, shoulder or hand problem interfered with your normal social activities with family, friends, neighbors, or groups? Quite a bit   During the past week, to what extent has your arm,  shoulder or hand problem limited your work or other regular daily activities Quite a bit   Arm, shoulder, or hand pain. Severe   Tingling (pins and needles) in your arm, shoulder, or hand Moderate   Difficulty Sleeping Severe difficulty   DASH Score 54.55 %                     PT Education - 03/06/15 1439    Education provided  Yes   Education Details shoulder exrercise, also breast post op exercise sheet, number for [facility] cancer fitness program    Person(s) Educated Patient   Methods Explanation;Demonstration;Handout   Comprehension Verbalized understanding;Returned demonstration                [facility] Goals - 03/06/15 1458    CC Long Term Goal  #1   Title Pt will verbalize and undestanding of lymphedema risk reduction practices and how to get a compression sleeve and gauntlet   Time 4   Period Weeks   Status New   CC Long Term Goal  #2   Title Pt will have 165 degrees of left shoulder abduction so that she can reach in cabinets and dress with greater ease.    Time 4   Period Weeks   Status New   CC Long Term Goal  #3   Title pt will be independent in a home exercise program for shoulder strength and range of motion    Time 4   Period Weeks   Status New            Plan - 03/06/15 1446    Clinical Impression Statement 45 yo female 4 weeks post double mastectomy with immediate expander insertion who had one lymph node removed from right side presents with tightness and pain in left chest area and decreased range of motion.  She has not knowledge of lymphedema risk reduction. She lives a long way away from our clinic, but will come back for  ABC class and exericse progression and determine at that point if she can get follow up PT at a clinic near her home.  She will need a class one compression sleeve and gauntlet for prophylaxis    Pt will benefit from skilled therapeutic intervention in order to improve on the following deficits  Decreased range of motion;Decreased knowledge of precautions;Decreased knowledge of use of DME;Pain;Decreased strength   Rehab Potential Good   Clinical Impairments Affecting Rehab Potential expander placement    PT Frequency 1x / week   PT Duration 4 weeks   PT Treatment/Interventions Therapeutic exercise;ADLs/Self Care Home Management;Passive range of motion;Patient/family education;Manual techniques   PT Next Visit Plan Upgrade home exercise to wall stretches as tolerated and supine scapular series  . Pt is to attend ABC class on Monday for lymphedema risk reduction education    Consulted and Agree with Plan of Care Patient         Problem List Patient Active Problem List   Diagnosis Date Noted  . 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 Genetic testing 01/08/2015  . Family history of breast cancer 12/21/2014  . Breast cancer of upper-outer quadrant of right female breast ([place]) 12/13/2014   Donato Heinz. Owens Shark, PT  03/06/2015, 3:07 PM  [place], [place], [address] Phone: [phone]   Fax:  [phone]  Name: DALIANA VARBLE MRN: [id number] Date of Birth: 11-Jan-1971

## 2015-03-06 NOTE — Patient Instructions (Signed)
Cane Overhead - Supine  Hold cane at thighs with both hands, extend arms straight over head. Hold _2__ seconds. Repeat 5__ times. Do _3-5__ times per day.    Hold endin one stretch out to the side and up pushing with other hand on stic, Copyright  VHI. All rights reserved.

## 2015-03-14 ENCOUNTER — Ambulatory Visit: Payer: BLUE CROSS/BLUE SHIELD | Admitting: Physical Therapy

## 2015-03-19 ENCOUNTER — Ambulatory Visit: Payer: BLUE CROSS/BLUE SHIELD

## 2015-03-19 DIAGNOSIS — M25612 Stiffness of left shoulder, not elsewhere classified: Secondary | ICD-10-CM | POA: Diagnosis not present

## 2015-03-19 DIAGNOSIS — Z9189 Other specified personal risk factors, not elsewhere classified: Secondary | ICD-10-CM

## 2015-03-19 NOTE — Therapy (Signed)
[place], [place], [address] Phone: [phone]   Fax:  [phone]  Physical Therapy Treatment  Patient Details  Name: Kathy Golden MRN: [id number] Date of Birth: 05-Sep-1970 Referring Provider: Dr. Iran Planas   Encounter Date: 03/19/2015      PT End of Session - 03/19/15 1047    Visit Number 2   Number of Visits 5   Date for PT Re-Evaluation 04/06/15   PT Start Time 1024   PT Stop Time 1107   PT Time Calculation (min) 43 min   Activity Tolerance Patient tolerated treatment well   Behavior During Therapy [facility] for tasks assessed/performed      Past Medical History  Diagnosis Date  . Breast cancer of upper-outer quadrant of right female breast ([place]) 12/13/2014  . Anxiety   . Depression   . Hot flashes   . Hemorrhoid     external    Past Surgical History  Procedure Laterality Date  . Cesarean section      x2  . Umbilical hernia repair    . Carpal tunnel release Right   . Nipple sparing mastectomy/sentinal lymph node biopsy/reconstruction/placement of tissue expander Bilateral 02/02/2015    Procedure: BILATERAL NIPPLE SPARING MASTECTOMY WITH RIGHT SENTINAL LYMPH NODE BIOPSY ;  Surgeon: Rolm Bookbinder, MD;  Location: [facility];  Service: General;  Laterality: Bilateral;  . Breast reconstruction with placement of tissue expander and flex hd (acellular hydrated dermis) Bilateral 02/02/2015    Procedure: BILATERAL BREAST RECONSTRUCTION WITH PLACEMENT OF TISSUE EXPANDER AND POSSIBLE ACELLULAR DERMIS;  Surgeon: Irene Limbo, MD;  Location: [facility];  Service: Plastics;  Laterality: Bilateral;    There were no vitals filed for this visit.  Visit Diagnosis:  At risk for lymphedema  Stiffness of left shoulder joint      Subjective Assessment - 03/19/15 1030    Subjective I have a friend who is a medical massage therapist and she worked on my shoulders/upper back with me in S/L and my Rt shoulder  ROM was alot better after that.    Currently in Pain? No/denies                         [facility] Adult PT Treatment/Exercise - 03/19/15 0001    Shoulder Exercises: Supine   Horizontal ABduction Strengthening;Both;10 reps;Theraband   Theraband Level (Shoulder Horizontal ABduction) Level 2 (Red)   External Rotation Strengthening;Both;10 reps;Theraband   Theraband Level (Shoulder External Rotation) Level 2 (Red)   Flexion Strengthening;Both;10 reps;Theraband  Narrow and wide grip   Theraband Level (Shoulder Flexion) Level 2 (Red)   Other Supine Exercises Bil D2 with red theraband 10 reps each    Shoulder Exercises: Pulleys   ABduction 2 minutes   Shoulder Exercises: Therapy Ball   Flexion 10 reps  With end range stretch at top leaning forward   ABduction 5 reps  Bil UE    Shoulder Exercises: ROM/Strengthening   Other ROM/Strengthening Exercises Doorway Pectoralis Stretch 5 reps, 20 second holds   Manual Therapy   Myofascial Release To Lt chest wall during PROM of Lt shoulder diagonolly and horiznotally with crosshands   Passive ROM To Lt shoulders into flexion, abduction and D2 all to pts tolerance                        Long Term Clinic Goals - 03/06/15 1458    CC Long Term Goal  #1  Title Pt will verbalize and undestanding of lymphedema risk reduction practices and how to get a compression sleeve and gauntlet   Time 4   Period Weeks   Status New   CC Long Term Goal  #2   Title Pt will have 165 degrees of left shoulder abduction so that she can reach in cabinets and dress with greater ease.    Time 4   Period Weeks   Status New   CC Long Term Goal  #3   Title pt will be independent in a home exercise program for shoulder strength and range of motion    Time 4   Period Weeks   Status New            Plan - 03/19/15 1048    Clinical Impression Statement Pt tolerated treatment very well today without any c/o pain and did well with initial  instruction of HEP. She plans to attend  ABC class today.   Pt will benefit from skilled therapeutic intervention in order to improve on the following deficits Decreased range of motion;Decreased knowledge of precautions;Decreased knowledge of use of DME;Pain;Decreased strength   Rehab Potential Good   Clinical Impairments Affecting Rehab Potential expander placement    PT Frequency 1x / week   PT Duration 4 weeks   PT Treatment/Interventions Therapeutic exercise;ADLs/Self Care Home Management;Passive range of motion;Patient/family education;Manual techniques   PT Next Visit Plan Review HEP prn and continue with manual therapy. Pt is to attend ABC class on Monday for lymphedema risk reduction education    Consulted and Agree with Plan of Care Patient        Problem List Patient Active Problem List   Diagnosis Date Noted  . 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 Genetic testing 01/08/2015  . Family history of breast cancer 12/21/2014  . Breast cancer of upper-outer quadrant of right female breast ([place]) 12/13/2014    Otelia Limes, PTA 03/19/2015, 11:10 AM  [place], [place], [address] Phone: [phone]   Fax:  [phone]  Name: Kathy Golden MRN: [id number] Date of Birth: [birth year]

## 2015-03-19 NOTE — Patient Instructions (Signed)
Over Head Pull: Narrow Grip     Cancer Rehab [phone]   On back, knees bent, feet flat, band across thighs, elbows straight but relaxed. Pull hands apart (start). Keeping elbows straight, bring arms up and over head, hands toward floor. Keep pull steady on band. Hold momentarily. Return slowly, keeping pull steady, back to start. Repeat _10__ times. Band color _red_____   Side Pull: Double Arm   On back, knees bent, feet flat. Arms perpendicular to body, shoulder level, elbows straight but relaxed. Pull arms out to sides, elbows straight. Resistance band comes across collarbones, hands toward floor. Hold momentarily. Slowly return to starting position. Repeat _1010__ times. Band color _red____   Elmer Picker   On back, knees bent, feet flat, left hand on left hip, right hand above left. Pull right arm DIAGONALLY (hip to shoulder) across chest. Bring right arm along head toward floor. Hold momentarily. Slowly return to starting position. Repeat _10__ times. Do with left arm. Band color _red_____   Shoulder Rotation: Double Arm   On back, knees bent, feet flat, elbows tucked at sides, bent 90, hands palms up. Pull hands apart and down toward floor, keeping elbows near sides. Hold momentarily. Slowly return to starting position. Repeat _10__ times. Band color _red_____   Also roll ball up wall until gentle stretch felt with slight forward lean. 10 reps. Then do to Lt side with forearm on ball. 5-10 reps

## 2015-03-26 ENCOUNTER — Ambulatory Visit: Payer: BLUE CROSS/BLUE SHIELD

## 2015-03-26 DIAGNOSIS — M25612 Stiffness of left shoulder, not elsewhere classified: Secondary | ICD-10-CM

## 2015-03-26 DIAGNOSIS — Z9189 Other specified personal risk factors, not elsewhere classified: Secondary | ICD-10-CM

## 2015-03-26 NOTE — Therapy (Addendum)
[place], [place], [address] Phone: [phone]   Fax:  [phone]  Physical Therapy Treatment  Patient Details  Name: Kathy Golden MRN: [id number] Date of Birth: 25-Mar-1970 Referring Provider: Dr. Iran Planas   Encounter Date: 03/26/2015      PT End of Session - 03/26/15 1207    Visit Number 3   Number of Visits 5   Date for PT Re-Evaluation 04/06/15   PT Start Time 1024   PT Stop Time 1107   PT Time Calculation (min) 43 min   Activity Tolerance Patient tolerated treatment well   Behavior During Therapy [facility] for tasks assessed/performed      Past Medical History  Diagnosis Date  . Breast cancer of upper-outer quadrant of right female breast ([place]) 12/13/2014  . Anxiety   . Depression   . Hot flashes   . Hemorrhoid     external    Past Surgical History  Procedure Laterality Date  . Cesarean section      x2  . Umbilical hernia repair    . Carpal tunnel release Right   . Nipple sparing mastectomy/sentinal lymph node biopsy/reconstruction/placement of tissue expander Bilateral 02/02/2015    Procedure: BILATERAL NIPPLE SPARING MASTECTOMY WITH RIGHT SENTINAL LYMPH NODE BIOPSY ;  Surgeon: Rolm Bookbinder, MD;  Location: [place];  Service: General;  Laterality: Bilateral;  . Breast reconstruction with placement of tissue expander and flex hd (acellular hydrated dermis) Bilateral 02/02/2015    Procedure: BILATERAL BREAST RECONSTRUCTION WITH PLACEMENT OF TISSUE EXPANDER AND POSSIBLE ACELLULAR DERMIS;  Surgeon: Irene Limbo, MD;  Location: [place];  Service: Plastics;  Laterality: Bilateral;    There were no vitals filed for this visit.  Visit Diagnosis:  At risk for lymphedema  Stiffness of left shoulder joint      Subjective Assessment - 03/26/15 1030    Subjective I'm doing really well, been doing my HEP.    Pertinent History double mastectomy with immedicate expander placement on Dec.9  one sentinel node from right side and none on the left  side. Has not had chemotherapy and will not need to have it and will not need radiaiton She plans to have implant surgery in March    Patient Stated Goals to get the tightness from her shoulders             [facility] PT Assessment - 03/26/15 0001    AROM   Right Shoulder Flexion 154 Degrees   Right Shoulder ABduction 171 Degrees   Left Shoulder Flexion 158 Degrees   Left Shoulder ABduction 173 Degrees                     [facility] Adult PT Treatment/Exercise - 03/26/15 0001    Manual Therapy   Myofascial Release To Lt axilla and scapula during PROM   Passive ROM To Lt shoulder into flexion, abduction and D2 all to pts tolerance and Lt upper trap stretch passively                PT Education - 03/26/15 1210    Education provided Yes   Education Details Instructed pt briefly in and issued handout regarding lymphedema risk reduction practices. Also instructed pt to call [facility] for a prophylactic sleeve for when she flies end of March. Also verbally reviewed HEP with pt and issued green theraband for progression when she feels reday.   Methods Explanation;Handout   Comprehension Verbalized understanding  [facility] Goals - 03/26/15 1040    CC Long Term Goal  #1   Title Pt will verbalize and understanding of lymphedema risk reduction practices and how to get a compression sleeve and gauntlet  Instructed pt to go to [facility] for compression garments. She is to return for ABC Class but did briefly instruct her today on lymphedema risk reduction practices and issued handout to go with this.   Status Achieved   CC Long Term Goal  #2   Title Pt will have 165 degrees of left shoulder abduction so that she can reach in cabinets and dress with greater ease.   173 degrees attained 03/26/15   Status Achieved   CC Long Term Goal  #3   Title pt will be independent in a home exercise  program for shoulder strength and range of motion    Status Achieved            Plan - 03/26/15 1207    Clinical Impression Statement Pt has done very well with therapy in a short amount of time and she feels reday to discharge at this time and therapis agrees, especially since pt still plans to come to [facility] class (we had date wrong, it wasn't last week). She has met all goals at this time.    Pt will benefit from skilled therapeutic intervention in order to improve on the following deficits Decreased range of motion;Decreased knowledge of precautions;Decreased knowledge of use of DME;Pain;Decreased strength   Rehab Potential Good   Clinical Impairments Affecting Rehab Potential expander placement    PT Frequency 1x / week   PT Duration 4 weeks   PT Treatment/Interventions Therapeutic exercise;ADLs/Self Care Home Management;Passive range of motion;Patient/family education;Manual techniques   PT Next Visit Plan Discharge visit.   PT Home Exercise Plan Cont theraband scapular strengthening and end ROM stretching.    Recommended Other Services Pt to attend ABC class.   Consulted and Agree with Plan of Care Patient        Problem List Patient Active Problem List   Diagnosis Date Noted  . 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 Genetic testing 01/08/2015  . Family history of breast cancer 12/21/2014  . Breast cancer of upper-outer quadrant of right female breast ([place]) 12/13/2014    Otelia Limes, PTA 03/26/2015, 12:16 PM     PHYSICAL THERAPY DISCHARGE SUMMARY  Visits from Start of Care: 3  Current functional level related to goals / functional outcomes: Pt feels that she knows how to move forward with her self care    Remaining deficits: Still progressing strength program    Education / Equipment: Lymphedema risk reduction, HEP, use of compression for prophylaxis Plan: Patient agrees to discharge.  Patient goals were partially met. Patient is  being discharged due to meeting the stated rehab goals.  ?????        Maudry Diego, PT 03/27/2015 10:31 AM   [place], [place], [address] Phone: [phone]   Fax:  [phone]  Name: Kathy Golden MRN: [id number] Date of Birth: Oct 31, 1970

## 2015-04-04 ENCOUNTER — Encounter: Payer: Self-pay | Admitting: Hematology

## 2015-04-04 NOTE — Progress Notes (Signed)
Placed cancerfit form on desk for dr. Burr Medico.

## 2015-04-23 NOTE — H&P (Signed)
  Subjective:    Patient ID: Kathy Golden is a 45 y.o. female.  HPI Nearly 2 months post op.  Presented with a screening MMG and found to have calcifications in the right breast measuring 9 mm and biopsy showed high grade DCIS, ER/PR +. Patient elected for bilateral NSM with expander placement. Final pathology Right DCIS present focally less than 0.1 cm to inked edge of non oriented tissue present with mastectomy specimen, SLN negative. Left breast benign.  Genetics negative.  Quit smoking 5 weeks prior to surgey.  Prior D/DD cup, desires smaller. Mastectomy weight right 689 g left 488 g.  Review of Systems    Objective:   Physical Exam  Cardiovascular: Normal rate, regular rhythm and normal heart sounds.  Pulmonary/Chest: Effort normal and breath sounds normal.  Abdominal: Soft.  No hernias     Sn to nipple R 24.5 l 26 cm BW R 17 L 17 cm Nipple to IMF R 8 L 9 cm    Assessment:     DCIS Right UOQ  S/p NSM, TE/ADM reconstruction    Plan:     Plan second stage for removal expanders and placement implants, lipofilling bilateral chest. Patient has elected for round silicone.  Reviewed implant specific risks including contracture, infection requiring removal, rupture. Discussed ALCL and its incidence with textured devices- discussed my preference for textured devices to try to prevent lateral displacement. At this time patient agrees to textured implants, but will let me know if she changes her mind. Additional risks including but not limited to bleeding, seroma, hematoma, damage to deeper structures reviewed.   Reviewed fat grafting to add soft tissue to mastectomy flaps, try to prevent visible rippling or animation derformity. Reviewed variable take fat grafting, may need to repeat, abdominal donor site, post procedure compression for 4-6 weeks, risk contour deformities, fat necrosis of breast. Discussed will order multiple sizes, is ok with current volume but  desires mor projection. We agree she desires smaller volume than preop.   Natrelle 400 ml 133MX-12-T placed. right 400 ml total fill volume.  Left 400 ml total fill volume.   Irene Limbo, MD [facility] Plastic & Reconstructive Surgery [phone]

## 2015-04-24 ENCOUNTER — Encounter (HOSPITAL_BASED_OUTPATIENT_CLINIC_OR_DEPARTMENT_OTHER): Payer: Self-pay | Admitting: *Deleted

## 2015-04-24 DIAGNOSIS — J111 Influenza due to unidentified influenza virus with other respiratory manifestations: Secondary | ICD-10-CM

## 2015-04-24 DIAGNOSIS — R059 Cough, unspecified: Secondary | ICD-10-CM

## 2015-04-24 HISTORY — DX: Cough, unspecified: R05.9

## 2015-04-24 HISTORY — DX: Influenza due to unidentified influenza virus with other respiratory manifestations: J11.1

## 2015-04-26 ENCOUNTER — Telehealth: Payer: Self-pay | Admitting: Hematology

## 2015-04-26 NOTE — Telephone Encounter (Signed)
pt cld to r/s appt-gave pt r/s time & date °

## 2015-04-27 ENCOUNTER — Encounter: Payer: BLUE CROSS/BLUE SHIELD | Admitting: Nurse Practitioner

## 2015-05-01 ENCOUNTER — Ambulatory Visit (HOSPITAL_BASED_OUTPATIENT_CLINIC_OR_DEPARTMENT_OTHER)
Admission: RE | Admit: 2015-05-01 | Discharge: 2015-05-01 | Disposition: A | Discharge status: Home / Self Care | Payer: BLUE CROSS/BLUE SHIELD | Source: Ambulatory Visit | Attending: Plastic Surgery | Admitting: Plastic Surgery

## 2015-05-01 ENCOUNTER — Ambulatory Visit (HOSPITAL_BASED_OUTPATIENT_CLINIC_OR_DEPARTMENT_OTHER): Payer: BLUE CROSS/BLUE SHIELD | Admitting: Anesthesiology

## 2015-05-01 ENCOUNTER — Encounter (HOSPITAL_BASED_OUTPATIENT_CLINIC_OR_DEPARTMENT_OTHER): Payer: Self-pay | Admitting: Anesthesiology

## 2015-05-01 ENCOUNTER — Encounter (HOSPITAL_BASED_OUTPATIENT_CLINIC_OR_DEPARTMENT_OTHER)
Admission: RE | Disposition: A | Discharge status: Home / Self Care | Payer: Self-pay | Source: Ambulatory Visit | Attending: Plastic Surgery

## 2015-05-01 DIAGNOSIS — Z87891 Personal history of nicotine dependence: Secondary | ICD-10-CM | POA: Diagnosis not present

## 2015-05-01 DIAGNOSIS — Z9013 Acquired absence of bilateral breasts and nipples: Secondary | ICD-10-CM | POA: Insufficient documentation

## 2015-05-01 DIAGNOSIS — Z853 Personal history of malignant neoplasm of breast: Secondary | ICD-10-CM | POA: Diagnosis not present

## 2015-05-01 DIAGNOSIS — Z421 Encounter for breast reconstruction following mastectomy: Secondary | ICD-10-CM | POA: Insufficient documentation

## 2015-05-01 HISTORY — DX: Personal history of malignant neoplasm of breast: Z85.3

## 2015-05-01 HISTORY — DX: Cough: R05

## 2015-05-01 HISTORY — DX: Dental restoration status: Z98.811

## 2015-05-01 HISTORY — PX: REMOVAL OF BILATERAL TISSUE EXPANDERS WITH PLACEMENT OF BILATERAL BREAST IMPLANTS: SHX6431

## 2015-05-01 HISTORY — DX: Influenza due to unidentified influenza virus with other respiratory manifestations: J11.1

## 2015-05-01 HISTORY — PX: LIPOSUCTION WITH LIPOFILLING: SHX6436

## 2015-05-01 SURGERY — REMOVAL, TISSUE EXPANDER, BREAST, BILATERAL, WITH BILATERAL IMPLANT IMPLANT INSERTION
Anesthesia: General | Site: Breast | Laterality: Bilateral

## 2015-05-01 MED ORDER — ARTIFICIAL TEARS OP OINT
TOPICAL_OINTMENT | OPHTHALMIC | Status: DC | PRN
  Administered 2015-05-01: 1 via OPHTHALMIC

## 2015-05-01 MED ORDER — DEXAMETHASONE SODIUM PHOSPHATE 4 MG/ML IJ SOLN
INTRAMUSCULAR | Status: DC | PRN
  Administered 2015-05-01: 10 mg via INTRAVENOUS

## 2015-05-01 MED ORDER — LIDOCAINE HCL (PF) 1 % IJ SOLN
INTRAMUSCULAR | Status: AC
  Filled 2015-05-01: qty 60

## 2015-05-01 MED ORDER — OXYCODONE HCL 5 MG PO TABS
5.0000 mg | ORAL_TABLET | Freq: Once | ORAL | Status: AC
Start: 2015-05-01 — End: 2015-05-01
  Administered 2015-05-01: 5 mg via ORAL

## 2015-05-01 MED ORDER — OXYCODONE HCL 5 MG PO TABS
ORAL_TABLET | ORAL | Status: AC
  Filled 2015-05-01: qty 1

## 2015-05-01 MED ORDER — LIDOCAINE HCL (CARDIAC) 20 MG/ML IV SOLN
INTRAVENOUS | Status: DC | PRN
  Administered 2015-05-01: 60 mg via INTRAVENOUS

## 2015-05-01 MED ORDER — MIDAZOLAM HCL 2 MG/2ML IJ SOLN
INTRAMUSCULAR | Status: AC
  Filled 2015-05-01: qty 2

## 2015-05-01 MED ORDER — GLYCOPYRROLATE 0.2 MG/ML IJ SOLN
0.2000 mg | Freq: Once | INTRAMUSCULAR | Status: DC | PRN
Start: 2015-05-01 — End: 2015-05-01

## 2015-05-01 MED ORDER — DEXAMETHASONE SODIUM PHOSPHATE 10 MG/ML IJ SOLN
INTRAMUSCULAR | Status: AC
  Filled 2015-05-01: qty 1

## 2015-05-01 MED ORDER — OXYCODONE HCL 5 MG PO TABS: 5.0000 mg | ORAL_TABLET | ORAL | Status: DC | PRN

## 2015-05-01 MED ORDER — EPHEDRINE SULFATE 50 MG/ML IJ SOLN
INTRAMUSCULAR | Status: DC | PRN
  Administered 2015-05-01 (×2): 10 mg via INTRAVENOUS
  Administered 2015-05-01 (×2): 5 mg via INTRAVENOUS

## 2015-05-01 MED ORDER — FENTANYL CITRATE (PF) 100 MCG/2ML IJ SOLN
50.0000 ug | INTRAMUSCULAR | Status: AC | PRN
  Administered 2015-05-01 (×2): 25 ug via INTRAVENOUS
  Administered 2015-05-01: 100 ug via INTRAVENOUS
  Administered 2015-05-01: 50 ug via INTRAVENOUS

## 2015-05-01 MED ORDER — ROCURONIUM BROMIDE 100 MG/10ML IV SOLN
INTRAVENOUS | Status: DC | PRN
  Administered 2015-05-01: 40 mg via INTRAVENOUS

## 2015-05-01 MED ORDER — CEFAZOLIN SODIUM-DEXTROSE 2-3 GM-% IV SOLR
INTRAVENOUS | Status: AC
  Filled 2015-05-01: qty 50

## 2015-05-01 MED ORDER — SULFAMETHOXAZOLE-TRIMETHOPRIM 800-160 MG PO TABS: 1.0000 | ORAL_TABLET | Freq: Two times a day (BID) | ORAL | Status: DC

## 2015-05-01 MED ORDER — OXYCODONE HCL 5 MG PO TABS
5.0000 mg | ORAL_TABLET | Freq: Once | ORAL | Status: AC
  Administered 2015-05-01: 5 mg via ORAL

## 2015-05-01 MED ORDER — SODIUM BICARBONATE 4 % IV SOLN
INTRAVENOUS | Status: AC
  Filled 2015-05-01: qty 10

## 2015-05-01 MED ORDER — FENTANYL CITRATE (PF) 100 MCG/2ML IJ SOLN
INTRAMUSCULAR | Status: AC
  Filled 2015-05-01: qty 2

## 2015-05-01 MED ORDER — PROPOFOL 10 MG/ML IV BOLUS
INTRAVENOUS | Status: DC | PRN
  Administered 2015-05-01: 200 mg via INTRAVENOUS

## 2015-05-01 MED ORDER — ATROPINE SULFATE 0.4 MG/ML IJ SOLN
INTRAMUSCULAR | Status: AC
  Filled 2015-05-01: qty 1

## 2015-05-01 MED ORDER — LIDOCAINE HCL (CARDIAC) 20 MG/ML IV SOLN
INTRAVENOUS | Status: AC
  Filled 2015-05-01: qty 5

## 2015-05-01 MED ORDER — CEFAZOLIN SODIUM-DEXTROSE 2-3 GM-% IV SOLR
2.0000 g | INTRAVENOUS | Status: AC
  Administered 2015-05-01: 2 g via INTRAVENOUS

## 2015-05-01 MED ORDER — MEPERIDINE HCL 25 MG/ML IJ SOLN: 6.2500 mg | INTRAMUSCULAR | Status: DC | PRN

## 2015-05-01 MED ORDER — SCOPOLAMINE 1 MG/3DAYS TD PT72
1.0000 | MEDICATED_PATCH | Freq: Once | TRANSDERMAL | Status: DC | PRN
  Administered 2015-05-01: 1.5 mg via TRANSDERMAL

## 2015-05-01 MED ORDER — ONDANSETRON HCL 4 MG/2ML IJ SOLN
INTRAMUSCULAR | Status: DC | PRN
Start: 2015-05-01 — End: 2015-05-01
  Administered 2015-05-01: 4 mg via INTRAVENOUS

## 2015-05-01 MED ORDER — HYDROCODONE-ACETAMINOPHEN 7.5-325 MG PO TABS: 1.0000 | ORAL_TABLET | Freq: Once | ORAL | Status: DC | PRN

## 2015-05-01 MED ORDER — CHLORHEXIDINE GLUCONATE 4 % EX LIQD: 1.0000 "application " | Freq: Once | CUTANEOUS | Status: DC

## 2015-05-01 MED ORDER — ROCURONIUM BROMIDE 50 MG/5ML IV SOLN
INTRAVENOUS | Status: AC
  Filled 2015-05-01: qty 1

## 2015-05-01 MED ORDER — LACTATED RINGERS IV SOLN
INTRAVENOUS | Status: DC
  Administered 2015-05-01 (×2): 10 mL/h via INTRAVENOUS
  Administered 2015-05-01 (×2): via INTRAVENOUS

## 2015-05-01 MED ORDER — SUCCINYLCHOLINE CHLORIDE 20 MG/ML IJ SOLN
INTRAMUSCULAR | Status: AC
  Filled 2015-05-01: qty 1

## 2015-05-01 MED ORDER — ONDANSETRON HCL 4 MG/2ML IJ SOLN
INTRAMUSCULAR | Status: AC
  Filled 2015-05-01: qty 2

## 2015-05-01 MED ORDER — EPINEPHRINE HCL 1 MG/ML IJ SOLN
INTRAMUSCULAR | Status: AC
  Filled 2015-05-01: qty 1

## 2015-05-01 MED ORDER — SODIUM BICARBONATE 4 % IV SOLN
INTRAVENOUS | Status: DC | PRN
  Administered 2015-05-01: 500 mL via INTRAMUSCULAR

## 2015-05-01 MED ORDER — SUGAMMADEX SODIUM 200 MG/2ML IV SOLN
INTRAVENOUS | Status: DC | PRN
  Administered 2015-05-01: 120 mg via INTRAVENOUS

## 2015-05-01 MED ORDER — SODIUM CHLORIDE 0.9 % IV SOLN
INTRAVENOUS | Status: DC | PRN
  Administered 2015-05-01: 1000 mL

## 2015-05-01 MED ORDER — METOCLOPRAMIDE HCL 5 MG/ML IJ SOLN
10.0000 mg | Freq: Once | INTRAMUSCULAR | Status: DC | PRN
Start: 2015-05-01 — End: 2015-05-01

## 2015-05-01 MED ORDER — MIDAZOLAM HCL 2 MG/2ML IJ SOLN
1.0000 mg | INTRAMUSCULAR | Status: DC | PRN
  Administered 2015-05-01: 2 mg via INTRAVENOUS

## 2015-05-01 MED ORDER — SCOPOLAMINE 1 MG/3DAYS TD PT72
MEDICATED_PATCH | TRANSDERMAL | Status: AC
  Filled 2015-05-01: qty 1

## 2015-05-01 MED ORDER — FENTANYL CITRATE (PF) 100 MCG/2ML IJ SOLN
25.0000 ug | INTRAMUSCULAR | Status: DC | PRN
  Administered 2015-05-01: 25 ug via INTRAVENOUS
  Administered 2015-05-01: 50 ug via INTRAVENOUS
  Administered 2015-05-01 (×3): 25 ug via INTRAVENOUS

## 2015-05-01 SURGICAL SUPPLY — 91 items
BAG DECANTER FOR FLEXI CONT (MISCELLANEOUS) ×2 IMPLANT
BINDER ABDOMINAL 10 UNV 27-48 (MISCELLANEOUS) ×2 IMPLANT
BINDER ABDOMINAL 12 SM 30-45 (SOFTGOODS) IMPLANT
BINDER BREAST LRG (GAUZE/BANDAGES/DRESSINGS) IMPLANT
BINDER BREAST MEDIUM (GAUZE/BANDAGES/DRESSINGS) IMPLANT
BINDER BREAST XLRG (GAUZE/BANDAGES/DRESSINGS) ×2 IMPLANT
BINDER BREAST XXLRG (GAUZE/BANDAGES/DRESSINGS) IMPLANT
BLADE SURG 10 STRL SS (BLADE) IMPLANT
BLADE SURG 11 STRL SS (BLADE) ×2 IMPLANT
BLADE SURG 15 STRL LF DISP TIS (BLADE) ×1 IMPLANT
BLADE SURG 15 STRL SS (BLADE) ×1
BNDG GAUZE ELAST 4 BULKY (GAUZE/BANDAGES/DRESSINGS) ×4 IMPLANT
CANISTER LIPO FAT HARVEST (MISCELLANEOUS) ×2 IMPLANT
CANISTER SUCT 1200ML W/VALVE (MISCELLANEOUS) ×4 IMPLANT
CHLORAPREP W/TINT 26ML (MISCELLANEOUS) ×4 IMPLANT
COMPRESSION GARMENT LG MOREWEL (MISCELLANEOUS) IMPLANT
COMPRESSION GARMENT MD MOREWEL (MISCELLANEOUS) IMPLANT
COMPRESSION GARMENT XL MOREWEL (MISCELLANEOUS) IMPLANT
COVER BACK TABLE 60X90IN (DRAPES) ×2 IMPLANT
COVER MAYO STAND STRL (DRAPES) ×2 IMPLANT
COVER PROBE 5X48 (MISCELLANEOUS) ×1
DECANTER SPIKE VIAL GLASS SM (MISCELLANEOUS) IMPLANT
DRAIN CHANNEL 15F RND FF W/TCR (WOUND CARE) ×4 IMPLANT
DRAPE IMP U-DRAPE 54X76 (DRAPES) IMPLANT
DRAPE LAPAROSCOPIC ABDOMINAL (DRAPES) IMPLANT
DRSG PAD ABDOMINAL 8X10 ST (GAUZE/BANDAGES/DRESSINGS) ×8 IMPLANT
ELECT BLADE 4.0 EZ CLEAN MEGAD (MISCELLANEOUS) ×2
ELECT COATED BLADE 2.86 ST (ELECTRODE) ×2 IMPLANT
ELECT REM PT RETURN 9FT ADLT (ELECTROSURGICAL) ×2
ELECTRODE BLDE 4.0 EZ CLN MEGD (MISCELLANEOUS) ×1 IMPLANT
ELECTRODE REM PT RTRN 9FT ADLT (ELECTROSURGICAL) ×1 IMPLANT
EVACUATOR SILICONE 100CC (DRAIN) ×4 IMPLANT
FILTER LIPOSUCTION (MISCELLANEOUS) ×2 IMPLANT
GLOVE BIO SURGEON STRL SZ 6 (GLOVE) ×2 IMPLANT
GLOVE BIOGEL PI IND STRL 6.5 (GLOVE) IMPLANT
GLOVE BIOGEL PI IND STRL 7.0 (GLOVE) ×2 IMPLANT
GLOVE BIOGEL PI INDICATOR 6.5 (GLOVE)
GLOVE BIOGEL PI INDICATOR 7.0 (GLOVE) ×2
GLOVE ECLIPSE 6.5 STRL STRAW (GLOVE) ×2 IMPLANT
GLOVE EXAM NITRILE EXT CUFF MD (GLOVE) ×2 IMPLANT
GOWN STRL REUS W/ TWL LRG LVL3 (GOWN DISPOSABLE) ×2 IMPLANT
GOWN STRL REUS W/TWL LRG LVL3 (GOWN DISPOSABLE) ×2
IMPLANT BREAST EX FULL 560CC (Breast) ×4 IMPLANT
IV NS 500ML (IV SOLUTION)
IV NS 500ML BAXH (IV SOLUTION) IMPLANT
KIT CVR 48X5XPRB PLUP LF (MISCELLANEOUS) ×1 IMPLANT
KIT FILL SYSTEM UNIVERSAL (SET/KITS/TRAYS/PACK) IMPLANT
LINER CANISTER 1000CC FLEX (MISCELLANEOUS) ×2 IMPLANT
LIQUID BAND (GAUZE/BANDAGES/DRESSINGS) ×6 IMPLANT
NDL SAFETY ECLIPSE 18X1.5 (NEEDLE) ×3 IMPLANT
NEEDLE HYPO 18GX1.5 SHARP (NEEDLE) ×3
NEEDLE HYPO 25X1 1.5 SAFETY (NEEDLE) IMPLANT
NS IRRIG 1000ML POUR BTL (IV SOLUTION) ×2 IMPLANT
PACK BASIN DAY SURGERY FS (CUSTOM PROCEDURE TRAY) ×2 IMPLANT
PACK UNIVERSAL I (CUSTOM PROCEDURE TRAY) ×2 IMPLANT
PAD ALCOHOL SWAB (MISCELLANEOUS) ×2 IMPLANT
PENCIL BUTTON HOLSTER BLD 10FT (ELECTRODE) ×2 IMPLANT
PIN SAFETY STERILE (MISCELLANEOUS) ×2 IMPLANT
SHEET MEDIUM DRAPE 40X70 STRL (DRAPES) ×4 IMPLANT
SIZER BREAST GENERIC (SIZER) ×6 IMPLANT
SIZER BREAST REUSE GEL 545CC (SIZER)
SIZER BREAST REUSE GEL 560CC (SIZER)
SIZER BREAST REUSE GEL 580CC (SIZER)
SIZER BRST REUSE GEL 545CC (SIZER) IMPLANT
SIZER BRST REUSE GEL 560CC (SIZER) IMPLANT
SIZER BRST REUSE GEL 580CC (SIZER) IMPLANT
SLEEVE SCD COMPRESS KNEE MED (MISCELLANEOUS) ×2 IMPLANT
SPONGE LAP 18X18 X RAY DECT (DISPOSABLE) ×6 IMPLANT
STAPLER VISISTAT 35W (STAPLE) ×2 IMPLANT
SUT ETHILON 2 0 FS 18 (SUTURE) ×4 IMPLANT
SUT MNCRL AB 4-0 PS2 18 (SUTURE) ×4 IMPLANT
SUT PDS 3-0 CT2 (SUTURE)
SUT PDS AB 2-0 CT2 27 (SUTURE) IMPLANT
SUT PDS II 3-0 CT2 27 ABS (SUTURE) IMPLANT
SUT PROLENE 2 0 CT2 30 (SUTURE) ×6 IMPLANT
SUT VIC AB 3-0 PS1 18 (SUTURE)
SUT VIC AB 3-0 PS1 18XBRD (SUTURE) IMPLANT
SUT VIC AB 3-0 SH 27 (SUTURE) ×2
SUT VIC AB 3-0 SH 27X BRD (SUTURE) ×2 IMPLANT
SUT VICRYL 4-0 PS2 18IN ABS (SUTURE) ×6 IMPLANT
SYR 50ML LL SCALE MARK (SYRINGE) ×8 IMPLANT
SYR BULB IRRIGATION 50ML (SYRINGE) ×2 IMPLANT
SYR CONTROL 10ML LL (SYRINGE) IMPLANT
SYR TB 1ML LL NO SAFETY (SYRINGE) ×2 IMPLANT
SYRINGE 10CC LL (SYRINGE) ×6 IMPLANT
TOWEL OR 17X24 6PK STRL BLUE (TOWEL DISPOSABLE) ×4 IMPLANT
TUBE CONNECTING 20X1/4 (TUBING) ×2 IMPLANT
TUBING INFILTRATION IT-10001 (TUBING) ×2 IMPLANT
TUBING SET GRADUATE ASPIR 12FT (MISCELLANEOUS) ×2 IMPLANT
UNDERPAD 30X30 (UNDERPADS AND DIAPERS) ×4 IMPLANT
YANKAUER SUCT BULB TIP NO VENT (SUCTIONS) ×2 IMPLANT

## 2015-05-01 NOTE — Anesthesia Preprocedure Evaluation (Addendum)
Anesthesia Evaluation  Patient identified by MRN, date of birth, ID band Patient awake    Reviewed: Allergy & Precautions, NPO status , Patient's Chart, lab work & pertinent test results  History of Anesthesia Complications Negative for: history of anesthetic complications  Airway Mallampati: II  TM Distance: >3 FB Neck ROM: Full    Dental no notable dental hx. (+)    Pulmonary former smoker,  Hx/o cough recently - started on Tamiflu Last dose 04/30/2015 Confirmed Influenza A- no fever for 1 week   Pulmonary exam normal breath sounds clear to auscultation       Cardiovascular negative cardio ROS Normal cardiovascular exam Rhythm:Regular Rate:Normal     Neuro/Psych PSYCHIATRIC DISORDERS Anxiety Depression negative neurological ROS     GI/Hepatic negative GI ROS, Neg liver ROS,   Endo/Other  Breast Ca  Renal/GU negative Renal ROS  negative genitourinary   Musculoskeletal negative musculoskeletal ROS (+)   Abdominal   Peds  Hematology   Anesthesia Other Findings   Reproductive/Obstetrics negative OB ROS                          Anesthesia Physical Anesthesia Plan  ASA: II  Anesthesia Plan: General   Post-op Pain Management:    Induction: Intravenous  Airway Management Planned: LMA and Oral ETT  Additional Equipment:   Intra-op Plan:   Post-operative Plan: Extubation in OR  Informed Consent: I have reviewed the patients History and Physical, chart, labs and discussed the procedure including the risks, benefits and alternatives for the proposed anesthesia with the patient or authorized representative who has indicated his/her understanding and acceptance.   Dental advisory given  Plan Discussed with: CRNA, Anesthesiologist and Surgeon  Anesthesia Plan Comments:        Anesthesia Quick Evaluation

## 2015-05-01 NOTE — Discharge Instructions (Signed)
About my Jackson-Pratt Bulb Drain ° °What is a Jackson-Pratt bulb? °A Jackson-Pratt is a soft, round device used to collect drainage. It is connected to a long, thin drainage catheter, which is held in place by one or two small stiches near your surgical incision site. When the bulb is squeezed, it forms a vacuum, forcing the drainage to empty into the bulb. ° °Emptying the Jackson-Pratt bulb- °To empty the bulb: °1. Release the plug on the top of the bulb. °2. Pour the bulb's contents into a measuring container which your nurse will provide. °3. Record the time emptied and amount of drainage. Empty the drain(s) as often as your     doctor or nurse recommends. ° °Date                  Time                    Amount (Drain 1)                 Amount (Drain 2) ° °_____________________________________________________________________ ° °_____________________________________________________________________ ° °_____________________________________________________________________ ° °_____________________________________________________________________ ° °_____________________________________________________________________ ° °_____________________________________________________________________ ° °_____________________________________________________________________ ° °_____________________________________________________________________ ° °Squeezing the Jackson-Pratt Bulb- °To squeeze the bulb: °1. Make sure the plug at the top of the bulb is open. °2. Squeeze the bulb tightly in your fist. You will hear air squeezing from the bulb. °3. Replace the plug while the bulb is squeezed. °4. Use a safety pin to attach the bulb to your clothing. This will keep the catheter from     pulling at the bulb insertion site. ° °When to call your doctor- °Call your doctor if: °· Drain site becomes red, swollen or hot. °· You have a fever greater than 101 degrees F. °· There is oozing at the drain site. °· Drain falls out (apply a guaze  bandage over the drain hole and secure it with tape). °· Drainage increases daily not related to activity patterns. (You will usually have more drainage when you are active than when you are resting.) °· Drainage has a bad odor. ° ° °Post Anesthesia Home Care Instructions ° °Activity: °Get plenty of rest for the remainder of the day. A responsible adult should stay with you for 24 hours following the procedure.  °For the next 24 hours, DO NOT: °-Drive a car °-Operate machinery °-Drink alcoholic beverages °-Take any medication unless instructed by your physician °-Make any legal decisions or sign important papers. ° °Meals: °Start with liquid foods such as gelatin or soup. Progress to regular foods as tolerated. Avoid greasy, spicy, heavy foods. If nausea and/or vomiting occur, drink only clear liquids until the nausea and/or vomiting subsides. Call your physician if vomiting continues. ° °Special Instructions/Symptoms: °Your throat may feel dry or sore from the anesthesia or the breathing tube placed in your throat during surgery. If this causes discomfort, gargle with warm salt water. The discomfort should disappear within 24 hours. ° °If you had a scopolamine patch placed behind your ear for the management of post- operative nausea and/or vomiting: ° °1. The medication in the patch is effective for 72 hours, after which it should be removed.  Wrap patch in a tissue and discard in the trash. Wash hands thoroughly with soap and water. °2. You may remove the patch earlier than 72 hours if you experience unpleasant side effects which may include dry mouth, dizziness or visual disturbances. °3. Avoid touching the patch. Wash your hands with soap and water after contact with the   patch. °  ° ° °

## 2015-05-01 NOTE — Op Note (Signed)
Operative Note   DATE OF OPERATION: 3.7.2017  LOCATION: [place]- outpatient  SURGICAL DIVISION: Plastic Surgery  PREOPERATIVE DIAGNOSES:  1. History DCIS 2. Acquired absence bilateral breasts   POSTOPERATIVE DIAGNOSES:  same  PROCEDURE:  1. Removal bilateral tissue expanders and placement silicone implants 2. Lipofilling to bilateral chest 3. Left mastopexy 4. Bilateral capsulorraphy  SURGEON: Irene Limbo MD MBA  ASSISTANT: none  ANESTHESIA:  General.   EBL: 50 ml  COMPLICATIONS: None immediate.   INDICATIONS FOR PROCEDURE:  The patient, Kathy Golden, is a 45 y.o. female born on 1970-07-30, is here for second stage reconstruction following nipple sparing mastectomies and immediate expander and acellular dermis reconstruction.   FINDINGS: 1. Large central area of acellular dermis (Flex HD) not incorporated, not adherent to mastectomy flaps bilateral and excised. 2. 120 ml fat infiltrated over right reconstruction, 60 ml to left chest. 3. Natrelle Inspira Biocell Round Cohesive Extra projection gel implants 560 ml placed bilateral, Ref [id number]. RIGHT SN [id number]  DESCRIPTION OF PROCEDURE:  The patient's operative site was marked with the patient in the preoperative area to mark sternal notch, chest midline, anterior axillary lines, inframammary folds and areas of depression marked for lipofilling. Area for fat harvest from supra and infra umbilical abdomen marked.The patient was taken to the operating room. SCDs were placed and IV antibiotics were given. A time out was performed and all information was confirmed to be correct. The patient's chest and abdomen were then prepped and draped in a sterile fashion.  Incision made in right chest inframammary fold mastectomy scar. Incision carried through to capsule and expander removed. Sizer was placed, and I then directed attention to left breast. Incision made in prior scar and expander removed.  Sizer  placed and patient brought to upright sitting position. A Natrelle Inspira Cohesive, Extra projection 560 ml gel implant selected for bilateral breasts. Left nipple areolar complex noted to be lower than right and periareolar mastoplexy marked on left with tailor tacking. Anterior axillary line marked bilateral. Patient returned to supine position and lateral capsulorraphy performed with 2-0 prolene from anterior capsule to chest wall. This was completed to set lateral extent capsule bilateral. Area of left breast mastopexy from 9 to 12 to 1 o clock of 1 cm of skin excision completed. This excised skin was sent for pathology. Closure completed with 4-0 vicryl in dermis and skin closure with running 4-0 monocryl.  Stab incision made over bilateral lateral abdomen and tumescent fluid infiltrated over supraumbilical and infra umbilical abdomen, total 400 ml tumescent infiltrated. Power assisted liposuction performed to endpoint symmetric contour and soft tissue thickness, 400 ml total lipoaspirate. The fat was then washed and prepared by gravity for infiltration. Harvested fat was then infiltrated in subcutaneous and intramuscular planes over superior medial poles, axilla, and throughout bilateral mastectomy flaps.   At this time bilateral cavities irrigated with solution containing Ancef, gentamicin, and bacitracin. 15 Fr drains placed in right and left breast cavity and secured with 2-0 nylon. Implant placed in each cavity. Closure was completed with running 3-0 vicryl for approximation of capsule. 4-0 vicryl was placed in dermis and running 4-0 monocryl was used to close skin. Tissue adhesive was applied to IMF incisions and left mastopexy incision. The abdominal incisions were approximated with 4-0 monocryl. Dry dressing and abdominal and breast binder applied.   The patient was allowed to wake from anesthesia, extubated and taken to the recovery room in satisfactory condition.   SPECIMENS: left mastectomy  flap  DRAINS: 54 Fr JP in right and left breast  Irene Limbo, MD [facility] Plastic & Reconstructive Surgery [phone]

## 2015-05-01 NOTE — Anesthesia Postprocedure Evaluation (Signed)
Anesthesia Post Note  Patient: Kathy Golden  Procedure(s) Performed: Procedure(s) (LRB): REMOVAL OF BILATERAL TISSUE EXPANDERS WITH PLACEMENT OF BILATERAL BREAST IMPLANTS  (Bilateral) LIPOSUCTION WITH LIPOFILLING BILATERAL BREAST (Bilateral)  Patient location during evaluation: PACU Anesthesia Type: General Level of consciousness: awake and alert Pain management: pain level controlled Vital Signs Assessment: post-procedure vital signs reviewed and stable Respiratory status: spontaneous breathing, nonlabored ventilation and respiratory function stable Cardiovascular status: blood pressure returned to baseline and stable Postop Assessment: no signs of nausea or vomiting Anesthetic complications: no    Last Vitals:  Filed Vitals:   05/01/15 1145 05/01/15 1200  BP: 112/73 123/60  Pulse: 65 72  Temp:    Resp: 15 14    Last Pain:  Filed Vitals:   05/01/15 1215  PainSc: 5                  Kathy Golden

## 2015-05-01 NOTE — Transfer of Care (Signed)
Immediate Anesthesia Transfer of Care Note  Patient: Kathy Golden  Procedure(s) Performed: Procedure(s) with comments: REMOVAL OF BILATERAL TISSUE EXPANDERS WITH PLACEMENT OF BILATERAL BREAST IMPLANTS  (Bilateral) LIPOSUCTION WITH LIPOFILLING BILATERAL BREAST (Bilateral) - From abdomen  Patient Location: PACU  Anesthesia Type:General  Level of Consciousness: awake, sedated and responds to stimulation  Airway & Oxygen Therapy: Patient Spontanous Breathing and Patient connected to face mask oxygen  Post-op Assessment: Report given to RN, Post -op Vital signs reviewed and stable and Patient moving all extremities  Post vital signs: Reviewed and stable  Last Vitals:  Filed Vitals:   05/01/15 0644  BP: 96/66  Pulse: 61  Temp: 36.5 C  Resp: 20    Complications: No apparent anesthesia complications

## 2015-05-01 NOTE — Anesthesia Procedure Notes (Signed)
Procedure Name: Intubation Date/Time: 05/01/2015 7:33 AM Performed by: Baxter Flattery Pre-anesthesia Checklist: Patient identified, Emergency Drugs available, Suction available and Patient being monitored Patient Re-evaluated:Patient Re-evaluated prior to inductionOxygen Delivery Method: Circle System Utilized Preoxygenation: Pre-oxygenation with 100% oxygen Intubation Type: IV induction Ventilation: Mask ventilation without difficulty Laryngoscope Size: Miller and 2 Grade View: Grade II Tube type: Oral Number of attempts: 1 Airway Equipment and Method: Stylet Placement Confirmation: ETT inserted through vocal cords under direct vision,  positive ETCO2 and breath sounds checked- equal and bilateral Secured at: 22 cm Tube secured with: Tape Dental Injury: Teeth and Oropharynx as per pre-operative assessment

## 2015-05-01 NOTE — Interval H&P Note (Signed)
History and Physical Interval Note:  05/01/2015 6:54 AM  [name]  has presented today for surgery, with the diagnosis of ACQUIRED ABSENCE BREAST AND NIPPLE BILATERAL,DCIS HISTORY OF BREAST CANCER  The various methods of treatment have been discussed with the patient and family. After consideration of risks, benefits and other options for treatment, the patient has consented to  Procedure(s): REMOVAL OF BILATERAL TISSUE EXPANDERS WITH PLACEMENT OF BILATERAL BREAST IMPLANTS  (Bilateral) LIPOSUCTION WITH LIPOFILLING BILATERAL BREAST (Bilateral) as a surgical intervention .  The patient's history has been reviewed, patient examined, no change in status, stable for surgery.  I have reviewed the patient's chart and labs.  Questions were answered to the patient's satisfaction.     Kathy Golden

## 2015-05-02 ENCOUNTER — Encounter (HOSPITAL_BASED_OUTPATIENT_CLINIC_OR_DEPARTMENT_OTHER): Payer: Self-pay | Admitting: Plastic Surgery

## 2015-05-25 ENCOUNTER — Encounter: Payer: BLUE CROSS/BLUE SHIELD | Admitting: Nurse Practitioner

## 2015-05-28 ENCOUNTER — Encounter: Payer: Self-pay | Admitting: Nurse Practitioner

## 2015-05-28 DIAGNOSIS — C50411 Malignant neoplasm of upper-outer quadrant of right female breast: Secondary | ICD-10-CM

## 2015-05-28 NOTE — Progress Notes (Signed)
The Survivorship Care Plan was mailed to Kathy Golden as she was unable to come in to the Survivorship Clinic for an in-person visit at this time. A letter was mailed to her outlining the purpose of the content of the care plan, as well as encouraging her to reach out to me with any questions or concerns.  My business card was included in the correspondence to the patient as well.  A copy of the care plan was also routed/faxed/mailed to BEAL, SHERI, PA-C, the patient's PCP.  I will not be placing any follow-up appointments to the Survivorship Clinic for Kathy Golden, but I am happy to see her at any time in the future for any survivorship concerns that may arise. Thank you for allowing me to participate in her care!  Kenn File, [place] [phone]

## 2015-11-01 ENCOUNTER — Other Ambulatory Visit: Payer: Self-pay | Admitting: Plastic Surgery

## 2015-11-01 DIAGNOSIS — N63 Unspecified lump in unspecified breast: Secondary | ICD-10-CM

## 2015-11-07 ENCOUNTER — Other Ambulatory Visit: Payer: BLUE CROSS/BLUE SHIELD

## 2015-11-07 ENCOUNTER — Ambulatory Visit
Admission: RE | Admit: 2015-11-07 | Discharge: 2015-11-07 | Disposition: A | Discharge status: Home / Self Care | Payer: BLUE CROSS/BLUE SHIELD | Source: Ambulatory Visit | Attending: Plastic Surgery | Admitting: Plastic Surgery

## 2015-11-07 DIAGNOSIS — N63 Unspecified lump in unspecified breast: Secondary | ICD-10-CM

## 2015-12-10 NOTE — H&P (Signed)
Subjective:    Patient ID: Kathy Golden is a 45 y.o. female.  Follow-up   7 months post op implant exchange and 4 months post tattoo to areaola.  C/o is some flatness medial right breast and plan for additional lipofilling and revision left mastopexy. Since last visit obtained US right showing numerous areas consistent with fat necrosis.  Presented with a screening MMG and found to have calcifications in the right breast measuring 9 mm and biopsy showed high grade DCIS, ER/PR +. Patient elected for bilateral NSM with expander placement. Final pathology Right DCIS present focally less than 0.1 cm to inked edge of non oriented tissue present with mastectomy specimen, SLN negative. Left breast benign.  Genetics negative.  Quit smoking prior to mastectomy surgery.  Prior D/DD cup, desires smaller. Mastectomy weight right 689 g left 488 g.  Review of Systems    Objective:   Physical Exam  Cardiovascular: Normal rate, regular rhythm and normal heart sounds.   Pulmonary/Chest: Effort normal and breath sounds normal.     Chest: SN to nipple R 24 L 24, left peri areolar scar from 9 to 12 to 3 o clock, left NAC wider than right right NAC diameter 40 mm left 48 mm  In area of concern medial to right NAC some flatness of contour, this is area of pectoralis muscle and animation present greater on right Right reconstruction, lateral to NAC two 1 cm nodular areas Symmetric volume, good upper pole transition  Assessment:     DCIS Right UOQ  S/p NSM, TE/ADM reconstruction S/p placement silicone implants, lipofilling, removal non incorporated ADM, left periareolar mastopexy S/p tattoo right NAC    Plan:     Plan additional fat grafting to area of concern over right breast, discussed this may mask some of animation deformity but breast will still move. Counseled this procedure will produce additional areas fat necrosis that may present as lumps and cause additional concern to her. We  Reviewed asymmetry of NAC post periareolar mastopexy, plan revision with circumferential scar and permanent suture. Reviewed variable take fat grafting, risk infection permanent suture left or palpability, need to remove. Reviewed OP surgery, no drains, need for abdominal compression x 4-6 weeks. Patient has questions regarding loss NAC-counseled low risk, not same concerns as at time of mastectomy.  Rx for oxycodone given.  120 ml fat infiltrated over right reconstruction, 60 ml to left chest. Natrelle Inspira Biocell Round Cohesive Extra projection gel implants 560 ml placed bilateral.    Irene Limbo, MD [facility] [phone], pin [phone]

## 2015-12-17 ENCOUNTER — Encounter (HOSPITAL_BASED_OUTPATIENT_CLINIC_OR_DEPARTMENT_OTHER): Payer: Self-pay | Admitting: *Deleted

## 2015-12-24 ENCOUNTER — Ambulatory Visit (HOSPITAL_BASED_OUTPATIENT_CLINIC_OR_DEPARTMENT_OTHER): Admit: 2015-12-24 | Payer: BLUE CROSS/BLUE SHIELD | Admitting: Plastic Surgery

## 2015-12-24 HISTORY — DX: Other complications of anesthesia, initial encounter: T88.59XA

## 2015-12-24 HISTORY — DX: Malignant (primary) neoplasm, unspecified: C80.1

## 2015-12-24 HISTORY — DX: Other specified postprocedural states: Z98.890

## 2015-12-24 HISTORY — DX: Nicotine dependence, unspecified, uncomplicated: F17.200

## 2015-12-24 HISTORY — DX: Adverse effect of unspecified anesthetic, initial encounter: T41.45XA

## 2015-12-24 HISTORY — DX: Nausea with vomiting, unspecified: R11.2

## 2015-12-24 SURGERY — LIPOSUCTION, WITH FAT TRANSFER
Anesthesia: General | Laterality: Left

## 2016-01-07 ENCOUNTER — Other Ambulatory Visit: Payer: Self-pay | Admitting: Plastic Surgery

## 2016-01-07 DIAGNOSIS — N644 Mastodynia: Secondary | ICD-10-CM

## 2016-01-22 ENCOUNTER — Other Ambulatory Visit: Payer: Self-pay | Admitting: Plastic Surgery

## 2016-01-22 ENCOUNTER — Ambulatory Visit
Admission: RE | Admit: 2016-01-22 | Discharge: 2016-01-22 | Disposition: A | Discharge status: Home / Self Care | Payer: BLUE CROSS/BLUE SHIELD | Source: Ambulatory Visit | Attending: Plastic Surgery | Admitting: Plastic Surgery

## 2016-01-22 DIAGNOSIS — IMO0002 Reserved for concepts with insufficient information to code with codable children: Secondary | ICD-10-CM

## 2016-01-22 DIAGNOSIS — N644 Mastodynia: Secondary | ICD-10-CM

## 2016-01-22 DIAGNOSIS — R229 Localized swelling, mass and lump, unspecified: Principal | ICD-10-CM

## 2016-01-23 ENCOUNTER — Ambulatory Visit
Admission: RE | Admit: 2016-01-23 | Discharge: 2016-01-23 | Disposition: A | Discharge status: Home / Self Care | Payer: BLUE CROSS/BLUE SHIELD | Source: Ambulatory Visit | Attending: Plastic Surgery | Admitting: Plastic Surgery

## 2016-01-23 DIAGNOSIS — IMO0002 Reserved for concepts with insufficient information to code with codable children: Secondary | ICD-10-CM

## 2016-01-23 DIAGNOSIS — R229 Localized swelling, mass and lump, unspecified: Principal | ICD-10-CM

## 2016-01-25 ENCOUNTER — Other Ambulatory Visit: Payer: BLUE CROSS/BLUE SHIELD

## 2016-06-19 IMAGING — MG MM BREAST STEREO BIOPSY*R*
3 series · 3 of 3 positions shown · non-contrast
Comparison: Previous exams.

ADDENDUM:
Pathology revealed intermediate to high grade ductal carcinoma in
situ with calcifications in the right breast. This was found to be
concordant by Dr. Shimone Awe. Pathology was discussed with the
patient by telephone. She reported doing well after the biopsy. Post
biopsy instructions and care were reviewed and her questions were
answered. She has been scheduled at [REDACTED]
[REDACTED] on December 20, 2014. My number was provided
for additional concerns and questions.

Pathology results reported by Ingunn Harpa Ronlor RN, BSN on December 11, 2014.
CLINICAL DATA: Calcifications right breast for biopsy
EXAM:
RIGHT BREAST STEREOTACTIC CORE NEEDLE BIOPSY

[R SPECIMEN]
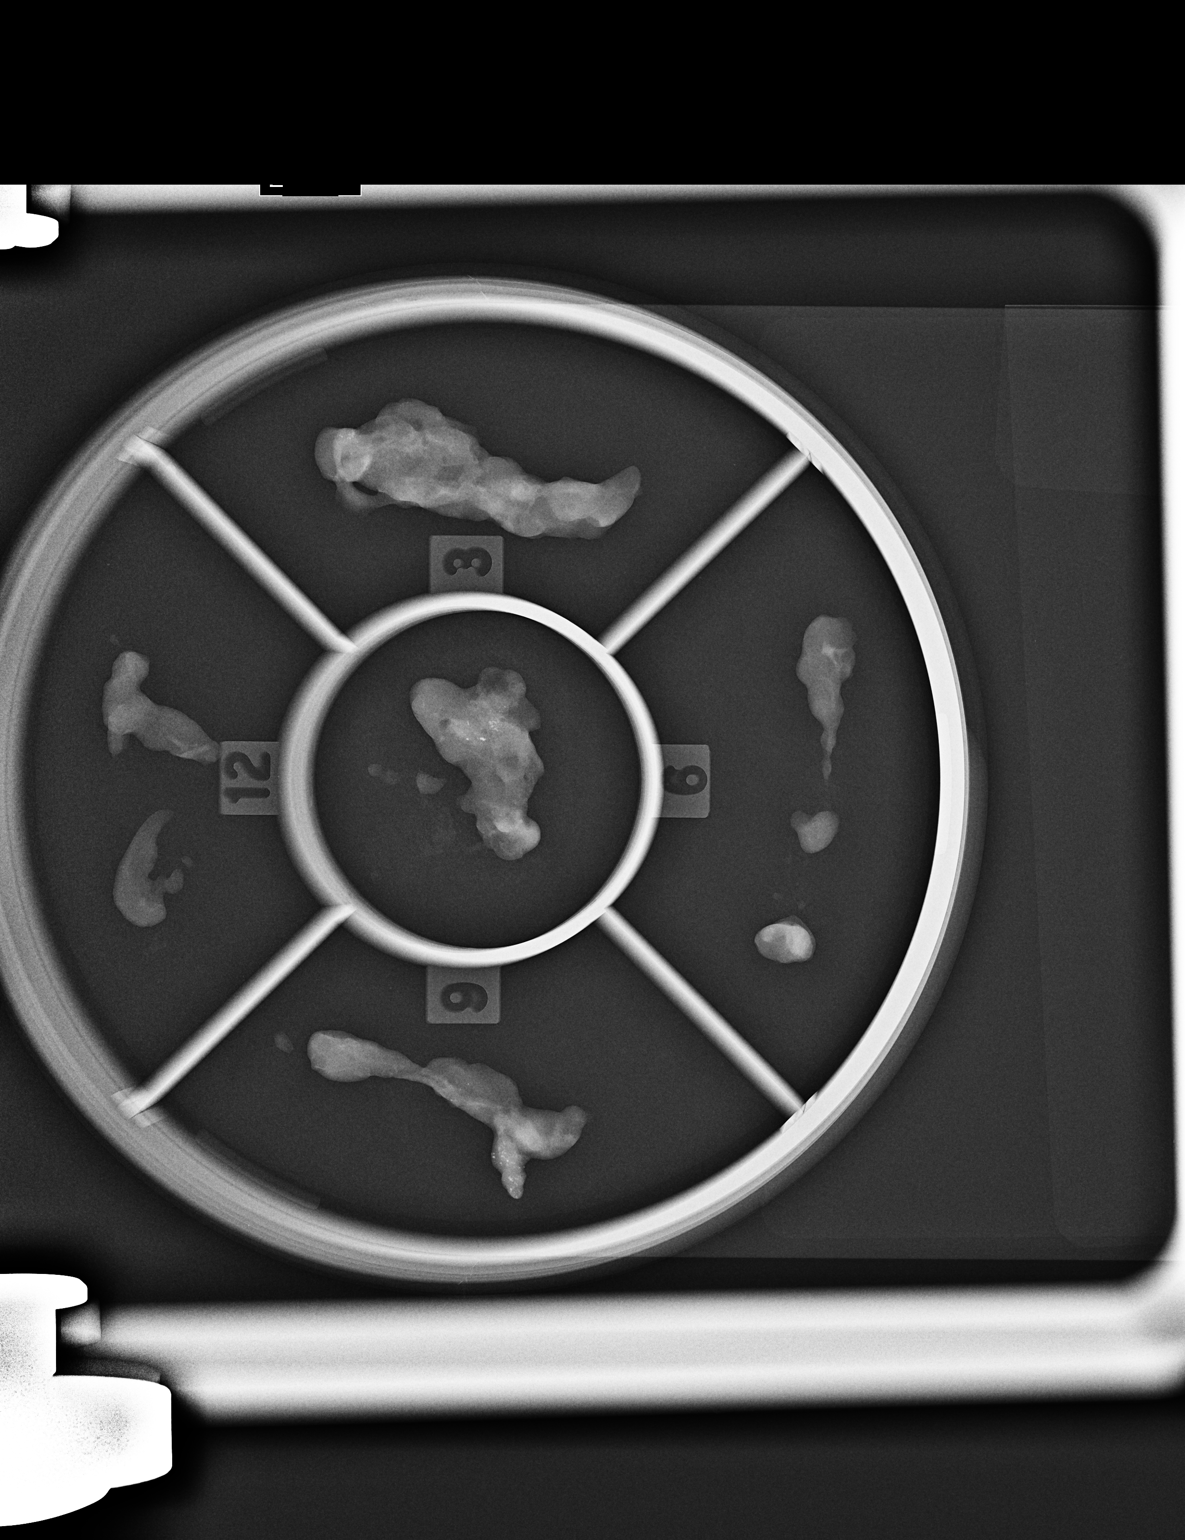

[R CC]
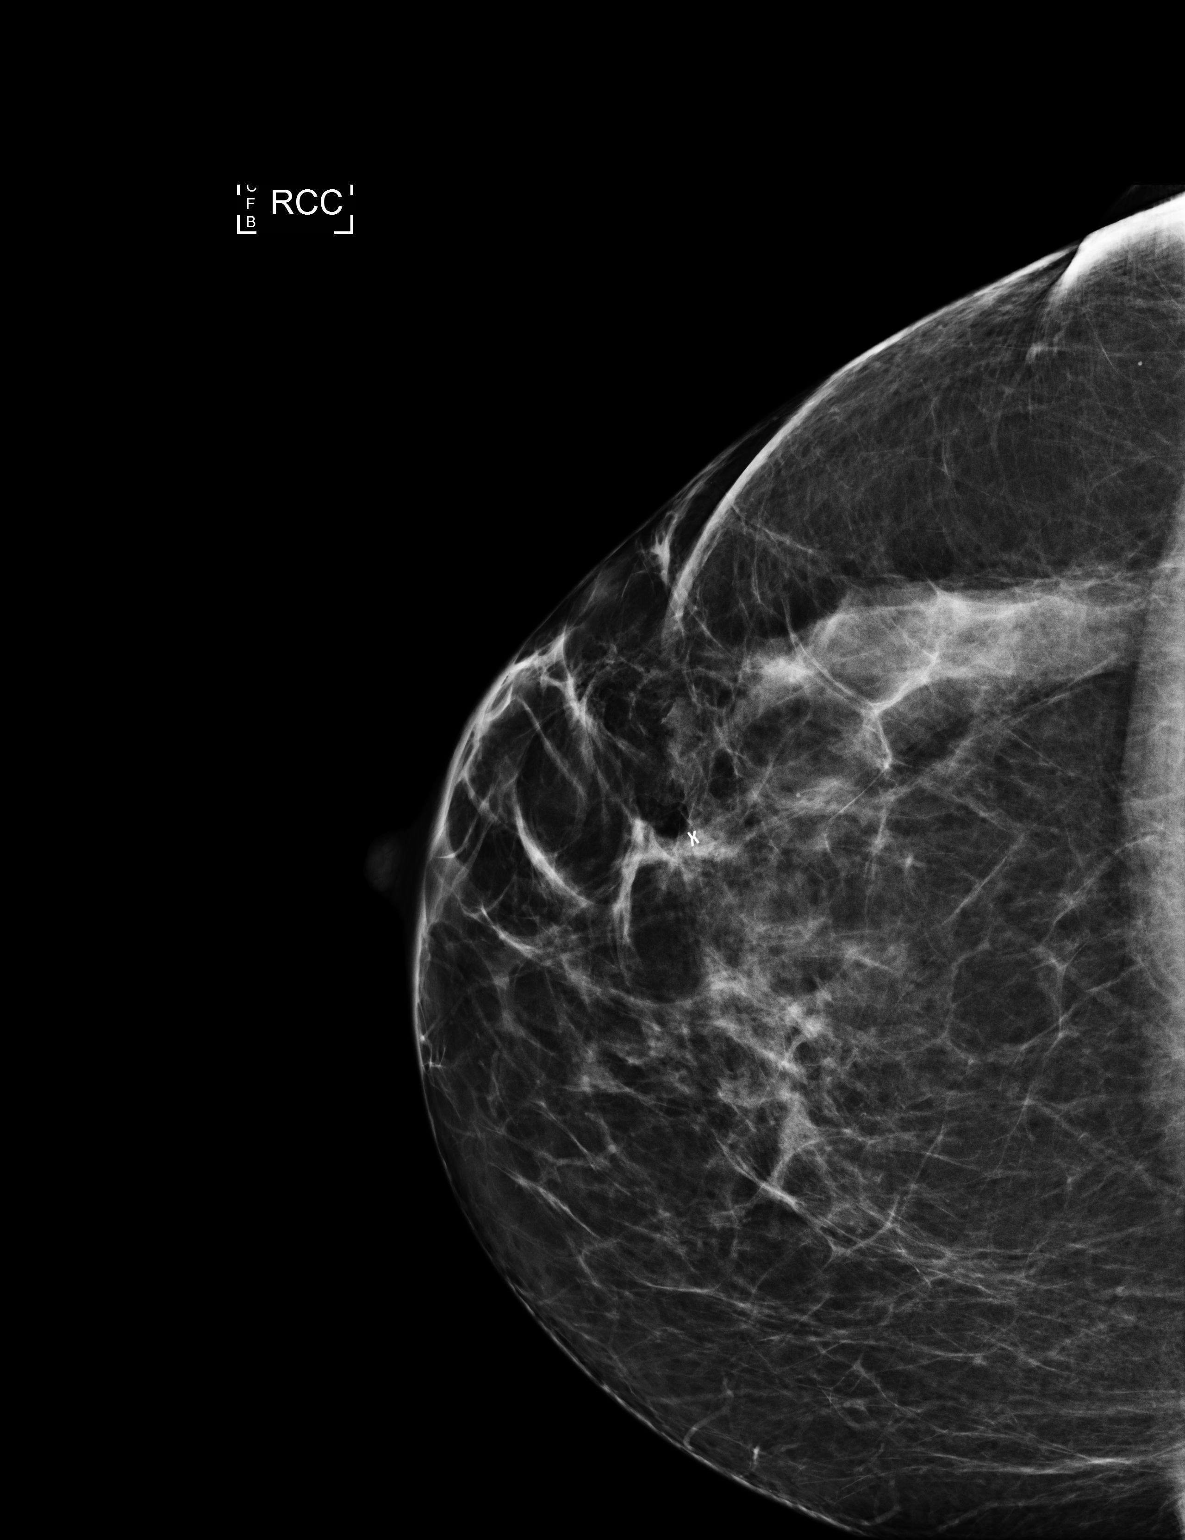

[R ML]
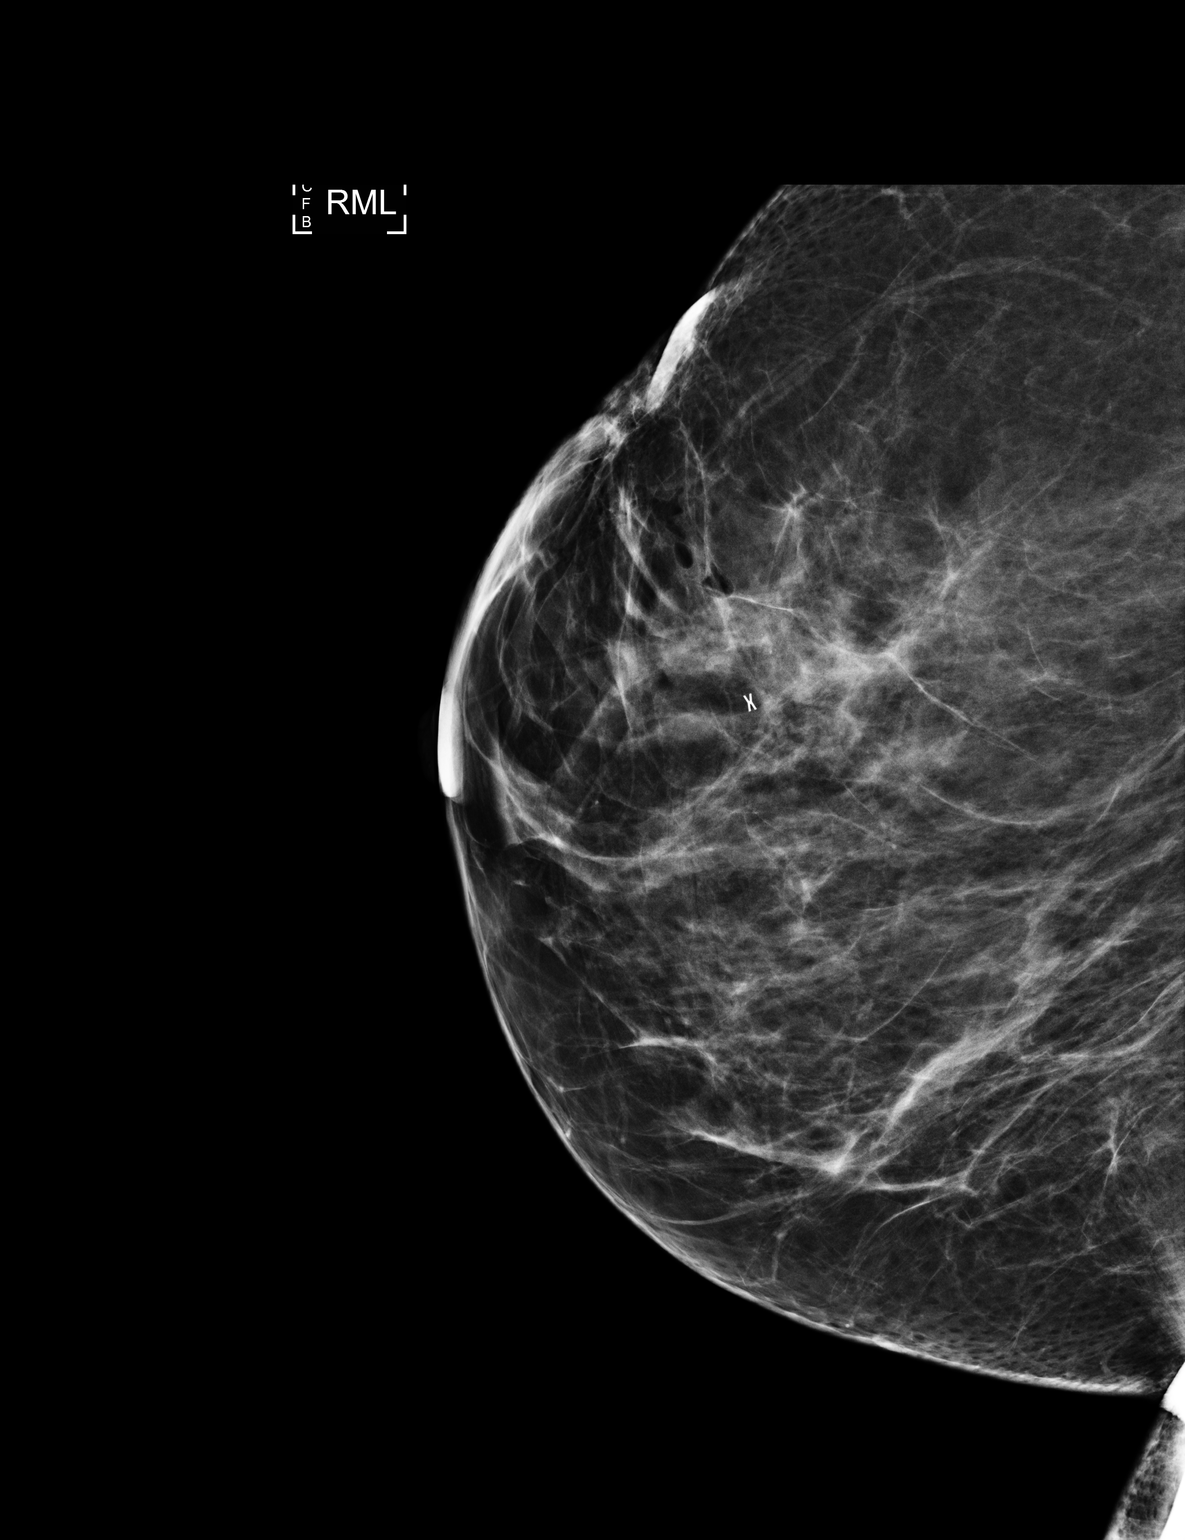

[3 of 3 positions shown; findings below may reference images not displayed]



Using sterile technique and 2% Lidocaine as local anesthetic, under
stereotactic guidance, a 9 gauge stereotactic vacuum assisted device
was used to perform core needle biopsy of calcifications in the
upper slightly lateral right breast. Using a cranial approach.
Specimen radiograph was performed showing inclusion a calcifications
of concern. Specimens with calcifications are identified for
pathology.

At the conclusion of the procedure, a tissue marker clip was
deployed into the biopsy cavity. Follow-up 2-view mammogram was
performed and dictated separately.
IMPRESSION: Stereotactic-guided biopsy of right. No apparent complications.

## 2017-07-01 NOTE — H&P (Signed)
Subjective:     Patient ID: Kathy Golden is a 46 y.o. female.  Follow-up   2 years post op implant exchange. Plan for revision reconstruction. Surgery date changed as family friend passed.  Presented with a screening MMG and found to have calcifications in the right breast measuring 9 mm and biopsy showed high grade DCIS, ER/PR +. Patient elected for bilateral NSM with expander placement. Final pathology Right DCIS present focally less than 0.1 cm to inked edge of non oriented tissue present with mastectomy specimen, SLN negative. Left breast benign.  Genetics negative.  Quit smoking prior to mastectomy surgery- she resumed this and states today she is still trying to quit.  Prior D/DD cup, desires smaller. Mastectomy weight right 689 g left 488 g.   Review of Systems    Objective:   Physical Exam  Cardiovascular: Normal rate, regular rhythm and normal heart sounds.   Pulmonary/Chest: Effort normal and breath sounds normal.  Abdominal: Soft.  Lymphadenopathy:    She has no axillary adenopathy.   Very tan No masses Chest: SN to nipple R 24.5 L 24, left peri areolar scar from 9 to 12 to 3 o clock, left NAC wider than right right NAC diameter 40 mm left 48 mm in craniocaudal direction Nipple to IMF R 9 L 9 cm  Bilateral animation, lateral displacement bilateral in supine position greater on right  Small depression right over area of caudal muscle unchanged  Assessment:     DCIS Right UOQ  S/p NSM, TE/ADM reconstruction S/p placement silicone implants, lipofilling, removal non incorporated ADM, left periareolar mastopexy S/p tattoo right NAC    Plan:  Plan bilateral capsulorraphy and ADM for support. Discussed OP surgery, drain for this. She would like to exchange implants to smooth. She is overall satisfied with volume- counseled any larger size would come with wider base implant and she does not desire this. She is scared of drains- reviewed this would likely be  for a week, would recommend ADM as part of plan for support as she has experienced lateral displacement even in setting textured device.  With regards to smoking, counseled she needs to be off all nicotine products as of today. If she cannot do this she should delay surgery. Reviewed risk wound healing problems, extrusion implants with active smoking not worth risk. She states she will do this.  Rx for oxycodone and Bactrim given.   120 ml fat infiltrated over right reconstruction, 60 ml to left chest. Natrelle Inspira Biocell Round Cohesive Extra projection gel implants 560 ml placed bilateral.       Brown Dunlap, MD MBA [facility] Re[facility] [phone], pin [phone]   

## 2017-07-21 ENCOUNTER — Encounter (HOSPITAL_BASED_OUTPATIENT_CLINIC_OR_DEPARTMENT_OTHER): Admission: RE | Payer: Self-pay | Source: Ambulatory Visit

## 2017-07-21 ENCOUNTER — Ambulatory Visit (HOSPITAL_BASED_OUTPATIENT_CLINIC_OR_DEPARTMENT_OTHER)
Admission: RE | Admit: 2017-07-21 | Payer: BLUE CROSS/BLUE SHIELD | Source: Ambulatory Visit | Admitting: Plastic Surgery

## 2017-07-21 SURGERY — REPLACEMENT, IMPLANT, BREAST
Anesthesia: General | Site: Breast | Laterality: Bilateral

## 2020-11-14 ENCOUNTER — Other Ambulatory Visit: Payer: Self-pay | Admitting: General Surgery
